# Patient Record
Sex: Male | Born: 1960 | Race: White | Hispanic: Refuse to answer | Marital: Married | Smoking: Never smoker
Health system: Southern US, Community
[De-identification: ages and names within clinical notes are randomized; demographics above are authoritative.]

## PROBLEM LIST (undated history)

## (undated) DIAGNOSIS — J3089 Other allergic rhinitis: Secondary | ICD-10-CM

## (undated) DIAGNOSIS — E785 Hyperlipidemia, unspecified: Secondary | ICD-10-CM

## (undated) DIAGNOSIS — N4340 Spermatocele of epididymis, unspecified: Secondary | ICD-10-CM

## (undated) DIAGNOSIS — K219 Gastro-esophageal reflux disease without esophagitis: Secondary | ICD-10-CM

## (undated) DIAGNOSIS — Z973 Presence of spectacles and contact lenses: Secondary | ICD-10-CM

## (undated) DIAGNOSIS — J45909 Unspecified asthma, uncomplicated: Secondary | ICD-10-CM

## (undated) DIAGNOSIS — Z9189 Other specified personal risk factors, not elsewhere classified: Secondary | ICD-10-CM

## (undated) HISTORY — PX: HERNIA REPAIR: SHX51

## (undated) HISTORY — PX: TONSILLECTOMY: SUR1361

## (undated) HISTORY — DX: Hyperlipidemia, unspecified: E78.5

---

## 2004-07-26 ENCOUNTER — Ambulatory Visit: Payer: Self-pay | Admitting: Family Medicine

## 2004-11-05 ENCOUNTER — Ambulatory Visit: Payer: Self-pay | Admitting: Family Medicine

## 2008-10-06 ENCOUNTER — Encounter: Admission: RE | Admit: 2008-10-06 | Discharge: 2008-10-06 | Payer: Self-pay | Admitting: Internal Medicine

## 2010-08-04 ENCOUNTER — Encounter: Payer: Self-pay | Admitting: Internal Medicine

## 2010-10-25 ENCOUNTER — Other Ambulatory Visit: Payer: Self-pay | Admitting: Internal Medicine

## 2010-10-25 DIAGNOSIS — E049 Nontoxic goiter, unspecified: Secondary | ICD-10-CM

## 2010-11-07 ENCOUNTER — Ambulatory Visit
Admission: RE | Admit: 2010-11-07 | Discharge: 2010-11-07 | Disposition: A | Discharge status: Home / Self Care | Payer: BC Managed Care – PPO | Source: Ambulatory Visit | Attending: Internal Medicine | Admitting: Internal Medicine

## 2010-11-07 DIAGNOSIS — E049 Nontoxic goiter, unspecified: Secondary | ICD-10-CM

## 2011-01-22 ENCOUNTER — Encounter (INDEPENDENT_AMBULATORY_CARE_PROVIDER_SITE_OTHER): Payer: Self-pay | Admitting: Surgery

## 2011-01-22 ENCOUNTER — Ambulatory Visit (INDEPENDENT_AMBULATORY_CARE_PROVIDER_SITE_OTHER): Payer: BC Managed Care – PPO | Admitting: Surgery

## 2011-01-22 VITALS — BP 112/78 | HR 75 | Temp 97.6°F | Wt 204.0 lb

## 2011-01-22 DIAGNOSIS — R221 Localized swelling, mass and lump, neck: Secondary | ICD-10-CM

## 2011-01-22 DIAGNOSIS — R220 Localized swelling, mass and lump, head: Secondary | ICD-10-CM

## 2011-01-22 NOTE — Progress Notes (Signed)
Chief Complaint  Patient presents with  . Mass    under thyroid gland    HISTORY: Patient is a 50 year old white male referred by his primary physician for evaluation of newly identified left neck mass. Patient was seen in April 2012. The neck examination was felt to be abnormal. Patient was referred for ultrasound of the thyroid performed on November 07, 2010. This showed a normal sized thyroid gland with heterogeneous tissue. No nodule was greater than 5 mm in size. However there was a solid mass inferior to the lower pole of the left thyroid lobe measuring 1.6 cm in dimension. Differential diagnosis included lymph node versus thyroid tissue versus parathyroid tissue. Biopsy was recommended.  Patient has no personal history of thyroid disease. He is never been on thyroid medication. He has had no prior head or neck surgery.  Patient's mother did undergo thyroidectomy for unknown cause. Patient's sister takes thyroid medication. There is no other family history of endocrinopathy.   Past Medical History  Diagnosis Date  . Asthma     childhood  . Alopecia      No current outpatient prescriptions on file.   Allergies  Allergen Reactions  . Compazine Nausea And Vomiting     Family History  Problem Relation Age of Onset  . Dementia Mother   . Thyroid disease Mother   . Colitis Mother   . Hypertension Father   . Thyroid disease Sister      History  Substance Use Topics  . Smoking status: Never Smoker   . Smokeless tobacco: Not on file  . Alcohol Use: Yes     social     PERTINENT POSITIVES FROM REVIEW OF SYSTEMS: Patient denies any symptoms related to the neck. He has no dysphagia. He has not noted any masses or tenderness. He denies tremor. He denies palpitations.   EXAM: Filed Vitals:   01/22/11 1034  BP: 112/78  Pulse: 75  Temp: 97.6 F (36.4 C)      LABORATORY RESULTS: See E-Chart for most recent results   RADIOLOGY RESULTS: See E-Chart or I-Site for  most recent results   IMPRESSION: Left neck mass, 1.6 cm, below the left thyroid lobe. Finding of uncertain significance. Differential diagnosis includes lymph node versus ectopic thyroid tissue versus parathyroid tissue.   PLAN: I discussed these findings at length with the patient. Given that he has a normal serum calcium level was 8.9, I think it is unlikely that this represents a parathyroid adenoma. The patient is completely asymptomatic. He is on ultrasound of the thyroid indicates probable underlying Hashimoto's thyroiditis.  I think the safest course of action is to proceed with ultrasound-guided fine-needle aspiration biopsy. We will make arrangements for the study in the near future. I will contact the patient with the results of the cytopathology. If this is a benign finding such as a benign lymph node or a benign thyroid tissue, then I think he can be safely observed with a followup ultrasound examination in 6 months. If there is any evidence of atypia or abnormality, we will consider surgical excisional biopsy at that time.  We will await the results of cytopathology in contact the patient.

## 2011-01-28 ENCOUNTER — Other Ambulatory Visit (INDEPENDENT_AMBULATORY_CARE_PROVIDER_SITE_OTHER): Payer: Self-pay | Admitting: Surgery

## 2011-01-28 DIAGNOSIS — R221 Localized swelling, mass and lump, neck: Secondary | ICD-10-CM

## 2011-01-28 DIAGNOSIS — R220 Localized swelling, mass and lump, head: Secondary | ICD-10-CM

## 2011-02-05 ENCOUNTER — Telehealth (INDEPENDENT_AMBULATORY_CARE_PROVIDER_SITE_OTHER): Payer: Self-pay | Admitting: Surgery

## 2011-02-12 ENCOUNTER — Other Ambulatory Visit (HOSPITAL_COMMUNITY)
Admission: RE | Admit: 2011-02-12 | Discharge: 2011-02-12 | Disposition: A | Discharge status: Home / Self Care | Payer: BC Managed Care – PPO | Source: Ambulatory Visit | Attending: Interventional Radiology | Admitting: Interventional Radiology

## 2011-02-12 ENCOUNTER — Ambulatory Visit
Admission: RE | Admit: 2011-02-12 | Discharge: 2011-02-12 | Disposition: A | Discharge status: Home / Self Care | Payer: BC Managed Care – PPO | Source: Ambulatory Visit | Attending: Surgery | Admitting: Surgery

## 2011-02-12 DIAGNOSIS — R220 Localized swelling, mass and lump, head: Secondary | ICD-10-CM

## 2011-02-12 DIAGNOSIS — E049 Nontoxic goiter, unspecified: Secondary | ICD-10-CM | POA: Insufficient documentation

## 2011-03-03 ENCOUNTER — Other Ambulatory Visit (INDEPENDENT_AMBULATORY_CARE_PROVIDER_SITE_OTHER): Payer: Self-pay | Admitting: Surgery

## 2011-03-03 ENCOUNTER — Telehealth (INDEPENDENT_AMBULATORY_CARE_PROVIDER_SITE_OTHER): Payer: Self-pay

## 2011-03-03 DIAGNOSIS — E041 Nontoxic single thyroid nodule: Secondary | ICD-10-CM

## 2011-03-03 NOTE — Telephone Encounter (Signed)
Results given to patient Friday 03/31/2011- Thyroid ultrasound ordered and to be completed in February 2012. RMP

## 2011-06-26 ENCOUNTER — Encounter (INDEPENDENT_AMBULATORY_CARE_PROVIDER_SITE_OTHER): Payer: Self-pay | Admitting: Surgery

## 2011-08-12 ENCOUNTER — Other Ambulatory Visit: Payer: Self-pay | Admitting: Ophthalmology

## 2011-08-14 ENCOUNTER — Encounter (INDEPENDENT_AMBULATORY_CARE_PROVIDER_SITE_OTHER): Payer: BC Managed Care – PPO | Admitting: Surgery

## 2011-09-03 ENCOUNTER — Ambulatory Visit
Admission: RE | Admit: 2011-09-03 | Discharge: 2011-09-03 | Disposition: A | Discharge status: Home / Self Care | Payer: BC Managed Care – PPO | Source: Ambulatory Visit | Attending: Surgery | Admitting: Surgery

## 2011-09-03 DIAGNOSIS — E041 Nontoxic single thyroid nodule: Secondary | ICD-10-CM

## 2011-09-24 ENCOUNTER — Encounter (INDEPENDENT_AMBULATORY_CARE_PROVIDER_SITE_OTHER): Payer: Self-pay | Admitting: Surgery

## 2011-09-24 ENCOUNTER — Ambulatory Visit (INDEPENDENT_AMBULATORY_CARE_PROVIDER_SITE_OTHER): Payer: BC Managed Care – PPO | Admitting: Surgery

## 2011-09-24 VITALS — BP 132/78 | HR 68 | Temp 97.8°F | Resp 18 | Ht 69.0 in | Wt 210.0 lb

## 2011-09-24 DIAGNOSIS — D449 Neoplasm of uncertain behavior of unspecified endocrine gland: Secondary | ICD-10-CM

## 2011-09-24 NOTE — Patient Instructions (Signed)
Thyroid Diseases Your thyroid is a butterfly-shaped gland in your neck. It is located just above your collarbone. It is one of your endocrine glands, which make hormones. The thyroid helps set your metabolism. Metabolism is how your body gets energy from the foods you eat.  Millions of people have thyroid diseases. Women experience thyroid problems more often than men. In fact, overactive thyroid problems (hyperthyroidism) occur in 1% of all women. If you have a thyroid disease, your body may use energy more slowly or quickly than it should.  Thyroid problems also include an immune disease where your body reacts against your thyroid gland (called thyroiditis). A different problem involves lumps and bumps (called nodules) that develop in the gland. The nodules are usually, but not always, noncancerous. THE MOST COMMON THYROID PROBLEMS AND CAUSES ARE DISCUSSED BELOW There are many causes for thyroid problems. Treatment depends upon the exact diagnosis and includes trying to reset your body's metabolism to a normal rate. Hyperthyroidism Too much thyroid hormone from an overactive thyroid gland is called hyperthyroidism. In hyperthyroidism, the body's metabolism speeds up. One of the most frequent forms of hyperthyroidism is known as Graves' disease. Graves' disease tends to run in families. Although Graves' is thought to be caused by a problem with the immune system, the exact nature of the genetic problem is unknown. Hypothyroidism Too little thyroid hormone from an underactive thyroid gland is called hypothyroidism. In hypothyroidism, the body's metabolism is slowed. Several things can cause this condition. Most causes affect the thyroid gland directly and hurt its ability to make enough hormone.  Rarely, there may be a pituitary gland tumor (located near the base of the brain). The tumor can block the pituitary from producing thyroid-stimulating hormone (TSH). Your body makes TSH to stimulate the thyroid  to work properly. If the pituitary does not make enough TSH, the thyroid fails to make enough hormones needed for good health. Whether the problem is caused by thyroid conditions or by the pituitary gland, the result is that the thyroid is not making enough hormones. Hypothyroidism causes many physical and mental processes to become sluggish. The body consumes less oxygen and produces less body heat. Thyroid Nodules A thyroid nodule is a small swelling or lump in the thyroid gland. They are common. These nodules represent either a growth of thyroid tissue or a fluid-filled cyst. Both form a lump in the thyroid gland. Almost half of all people will have tiny thyroid nodules at some point in their lives. Typically, these are not noticeable until they become large and affect normal thyroid size. Larger nodules that are greater than a half inch across (about 1 centimeter) occur in about 5 percent of people. Although most nodules are not cancerous, people who have them should seek medical care to rule out cancer. Also, some thyroid nodules may produce too much thyroid hormone or become too large. Large nodules or a large gland can interfere with breathing or swallowing or may cause neck discomfort. Other problems Other thyroid problems include cancer and thyroiditis. Thyroiditis is a malfunction of the body's immune system. Normally, the immune system works to defend the body against infection and other problems. When the immune system is not working properly, it may mistakenly attack normal cells, tissues, and organs. Examples of autoimmune diseases are Hashimoto's thyroiditis (which causes low thyroid function) and Graves' disease (which causes excess thyroid function). SYMPTOMS  Symptoms vary greatly depending upon the exact type of problem with the thyroid. Hyperthyroidism-is when your thyroid is too   active and makes more thyroid hormone than your body needs. The most common cause is Graves' Disease. Too  much thyroid hormone can cause some or all of the following symptoms:  Anxiety.   Irritability.   Difficulty sleeping.   Fatigue.   A rapid or irregular heartbeat.   A fine tremor of your hands or fingers.   An increase in perspiration.   Sensitivity to heat.   Weight loss, despite normal food intake.   Brittle hair.   Enlargement of your thyroid gland (goiter).   Light menstrual periods.   Frequent bowel movements.  Graves' disease can specifically cause eye and skin problems. The skin problems involve reddening and swelling of the skin, often on your shins and on the top of your feet. Eye problems can include the following:  Excess tearing and sensation of grit or sand in either or both eyes.   Reddened or inflamed eyes.   Widening of the space between your eyelids.   Swelling of the lids and tissues around the eyes.   Light sensitivity.   Ulcers on the cornea.   Double vision.   Limited eye movements.   Blurred or reduced vision.  Hypothyroidism- is when your thyroid gland is not active enough. This is more common than hyperthyroidism. Symptoms can vary a lot depending of the severity of the hormone deficiency. Symptoms may develop over a long period of time and can include several of the following:  Fatigue.   Sluggishness.   Increased sensitivity to cold.   Constipation.   Pale, dry skin.   A puffy face.   Hoarse voice.   High blood cholesterol level.   Unexplained weight gain.   Muscle aches, tenderness and stiffness.   Pain, stiffness or swelling in your joints.   Muscle weakness.   Heavier than normal menstrual periods.   Brittle fingernails and hair.   Depression.  Thyroid Nodules - most do not cause signs or symptoms. Occasionally, some may become so large that you can feel or even see the swelling at the base of your neck. You may realize a lump or swelling is there when you are shaving or putting on makeup. Men might become  aware of a nodule when shirt collars suddenly feel too tight. Some nodules produce too much thyroid hormone. This can produce the same symptoms as hyperthyroidism (see above). Thyroid nodules are seldom cancerous. However, a nodule is more likely to be malignant (cancerous) if it:  Grows quickly or feels hard.   Causes you to become hoarse or to have trouble swallowing or breathing.   Causes enlarged lymph nodes under your jaw or in your neck.  DIAGNOSIS  Because there are so many possible thyroid conditions, your caregiver may ask for a number of tests. They will do this in order to narrow down the exact diagnosis. These tests can include:  Blood and antibody tests.   Special thyroid scans using small, safe amounts of radioactive iodine.   Ultrasound of the thyroid gland (particularly if there is a nodule or lump).   Biopsy. This is usually done with a special needle. A needle biopsy is a procedure to obtain a sample of cells from the thyroid. The tissue will be tested in a lab and examined under a microscope.  TREATMENT  Treatment depends on the exact diagnosis. Hyperthyroidism  Beta-blockers help relieve many of the symptoms.   Anti-thyroid medications prevent the thyroid from making excess hormones.   Radioactive iodine treatment can destroy overactive thyroid   cells. The iodine can permanently decrease the amount of hormone produced.   Surgery to remove the thyroid gland.   Treatments for eye problems that come from Graves' disease also include medications and special eye surgery, if felt to be appropriate.  Hypothyroidism Thyroid replacement with levothyroxine is the mainstay of treatment. Treatment with thyroid replacement is usually lifelong and will require monitoring and adjustment from time to time. Thyroid Nodules  Watchful waiting. If a small nodule causes no symptoms or signs of cancer on biopsy, then no treatment may be chosen at first. Re-exam and re-checking blood  tests would be the recommended follow-up.   Anti-thyroid medications or radioactive iodine treatment may be recommended if the nodules produce too much thyroid hormone (see Treatment for Hyperthyroidism above).   Alcohol ablation. Injections of small amounts of ethyl alcohol (ethanol) can cause a non-cancerous nodule to shrink in size.   Surgery (see Treatment for Hyperthyroidism above).  HOME CARE INSTRUCTIONS   Take medications as instructed.   Follow through on recommended testing.  SEEK MEDICAL CARE IF:   You feel that you are developing symptoms of Hyperthyroidism or Hypothyroidism as described above.   You develop a new lump/nodule in the neck/thyroid area that you had not noticed before.   You feel that you are having side effects from medicines prescribed.   You develop trouble breathing or swallowing.  SEEK IMMEDIATE MEDICAL CARE IF:   You develop a fever of 102 F (38.9 C) or higher.   You develop severe sweating.   You develop palpitations and/or rapid heart beat.   You develop shortness of breath.   You develop nausea and vomiting.   You develop extreme shakiness.   You develop agitation.   You develop lightheadedness or have a fainting episode.  Document Released: 04/27/2007 Document Revised: 06/19/2011 Document Reviewed: 04/27/2007 [employer] Patient Information 2012 [employer]. 

## 2011-09-24 NOTE — Progress Notes (Signed)
Visit Diagnoses: 1. Neoplasm of uncertain behavior, left thyroid lobe     HISTORY: The patient is a 51 year old white male who returns for followup of a left neck mass. Patient was evaluated in July 2013. An attempt was made at fine needle aspiration biopsy but a non-diagnostic sample was obtained. At my request the patient underwent a followup thyroid ultrasound in February 2013. This shows a normal-sized thyroid gland which is inhomogeneous. There is a nodule at the left inferior aspect of the thyroid gland measuring 1.6 x 0.8 x 1.0 cm. The radiologist is not certain whether this represents a thyroid nodule for an enlarged parathyroid gland. Calcium levels have been normal.  PERTINENT REVIEW OF SYSTEMS: Patient denies any change in self-examination. He denies any new masses. He denies any discomfort. He denies any compressive symptoms. He denies tremor. He denies palpitations except with stress.  EXAM: HEENT: normocephalic; pupils equal and reactive; sclerae clear; dentition good; mucous membranes moist NECK:  On palpation there are no dominant or discrete masses noted; symmetric on extension; no palpable anterior or posterior cervical lymphadenopathy; no supraclavicular masses; no tenderness CHEST: clear to auscultation bilaterally without rales, rhonchi, or wheezes CARDIAC: regular rate and rhythm without significant murmur; peripheral pulses are full EXT:  non-tender without edema; no deformity NEURO: no gross focal deficits; no sign of tremor   IMPRESSION: Left neck mass adjacent to left lobe of thyroid, 1.6 cm, of undetermined significance  PLAN: The patient and I discussed all of the above findings. At this point I see no wear for surgical intervention. I would like to have some laboratory studies checked and we will obtain these at the time of his routine physical exam in April with his primary care physician. I have requested a TSH level, and intact PTH level, and a serum calcium  level. Also, I am going to ask the patient to return in one year with a followup thyroid ultrasound performed prior to that office visit.  Velora Heckler, MD, FACS General & Endocrine Surgery [facility]

## 2011-12-11 ENCOUNTER — Encounter (INDEPENDENT_AMBULATORY_CARE_PROVIDER_SITE_OTHER): Payer: Self-pay

## 2012-09-06 ENCOUNTER — Ambulatory Visit
Admission: RE | Admit: 2012-09-06 | Discharge: 2012-09-06 | Disposition: A | Discharge status: Home / Self Care | Payer: BC Managed Care – PPO | Source: Ambulatory Visit | Attending: Surgery | Admitting: Surgery

## 2012-09-06 DIAGNOSIS — D449 Neoplasm of uncertain behavior of unspecified endocrine gland: Secondary | ICD-10-CM

## 2012-09-07 ENCOUNTER — Other Ambulatory Visit (INDEPENDENT_AMBULATORY_CARE_PROVIDER_SITE_OTHER): Payer: Self-pay

## 2012-09-07 ENCOUNTER — Telehealth (INDEPENDENT_AMBULATORY_CARE_PROVIDER_SITE_OTHER): Payer: Self-pay

## 2012-09-07 DIAGNOSIS — E042 Nontoxic multinodular goiter: Secondary | ICD-10-CM

## 2012-09-07 NOTE — Telephone Encounter (Signed)
Pt advised recall due. Pt requests to call back. Pt due for thyroid u/s at [facility] and [facility]. Order in epic.

## 2012-09-08 ENCOUNTER — Telehealth (INDEPENDENT_AMBULATORY_CARE_PROVIDER_SITE_OTHER): Payer: Self-pay | Admitting: General Surgery

## 2012-09-08 ENCOUNTER — Telehealth (INDEPENDENT_AMBULATORY_CARE_PROVIDER_SITE_OTHER): Payer: Self-pay

## 2012-09-08 NOTE — Telephone Encounter (Signed)
Pt notified of u/s result and to f/u with PCP. Pt states he will.

## 2012-09-08 NOTE — Telephone Encounter (Signed)
Pt called and reported he was contacted by Dr. Ardine Eng nurse.  Related the message in Epic.  States he has already had the U/S (on Monday) and would prefer not taking additional time off from work to come in, but he will if Dr. Gerrit Friends needs to see him. He can most easily on his cell phone:  [phone].

## 2012-09-08 NOTE — Telephone Encounter (Signed)
Message copied by Joanette Gula on Wed Sep 08, 2012  4:15 PM ------      Message from: Velora Heckler      Created: Wed Sep 08, 2012  3:28 PM       Cindy:            The ultrasound for this patient is fine - no new or dominant masses seen.            He may cancel his follow up with me.            No further visits needed.  He should see his primary care provider for annual physical exams of his thyroid.            tmg            Velora Heckler, MD, [facility]      Office: [phone]             ------

## 2013-12-09 ENCOUNTER — Other Ambulatory Visit: Payer: Self-pay | Admitting: Urology

## 2014-02-13 ENCOUNTER — Encounter (HOSPITAL_BASED_OUTPATIENT_CLINIC_OR_DEPARTMENT_OTHER): Payer: Self-pay | Admitting: *Deleted

## 2014-02-13 NOTE — Progress Notes (Signed)
NPO AFTER MN.  ARRIVE AT 0600.  NEEDS HG.  

## 2014-02-13 NOTE — Progress Notes (Signed)
02/13/14 1106  OBSTRUCTIVE SLEEP APNEA  Have you ever been diagnosed with sleep apnea through a sleep study? No  Do you snore loudly (loud enough to be heard through closed doors)?  1  Do you often feel tired, fatigued, or sleepy during the daytime? 0  Has anyone observed you stop breathing during your sleep? 0  Do you have, or are you being treated for high blood pressure? 0  BMI more than 35 kg/m2? 0  Age over 55 years old? 1  Neck circumference greater than 40 cm/16 inches? 1  Gender: 1  Obstructive Sleep Apnea Score 4  Score 4 or greater  Results sent to PCP

## 2014-02-16 NOTE — Anesthesia Preprocedure Evaluation (Addendum)
Anesthesia Evaluation  Patient identified by MRN, date of birth, ID band Patient awake    Reviewed: Allergy & Precautions, H&P , NPO status , Patient's Chart, lab work & pertinent test results  Airway Mallampati: II TM Distance: >3 FB Neck ROM: full    Dental no notable dental hx. (+) Teeth Intact, Dental Advisory Given   Pulmonary neg pulmonary ROS,  Stop bang 4 breath sounds clear to auscultation  Pulmonary exam normal       Cardiovascular Exercise Tolerance: Good negative cardio ROS  Rhythm:regular Rate:Normal     Neuro/Psych negative neurological ROS  negative psych ROS   GI/Hepatic negative GI ROS, Neg liver ROS, GERD-  Medicated and Controlled,  Endo/Other  negative endocrine ROS  Renal/GU negative Renal ROS  negative genitourinary   Musculoskeletal   Abdominal   Peds  Hematology negative hematology ROS (+)   Anesthesia Other Findings   Reproductive/Obstetrics negative OB ROS                          Anesthesia Physical Anesthesia Plan  ASA: II  Anesthesia Plan: General   Post-op Pain Management:    Induction: Intravenous  Airway Management Planned: LMA  Additional Equipment:   Intra-op Plan:   Post-operative Plan: Extubation in OR  Informed Consent: I have reviewed the patients History and Physical, chart, labs and discussed the procedure including the risks, benefits and alternatives for the proposed anesthesia with the patient or authorized representative who has indicated his/her understanding and acceptance.   Dental Advisory Given  Plan Discussed with: CRNA and Surgeon  Anesthesia Plan Comments:         Anesthesia Quick Evaluation

## 2014-02-17 ENCOUNTER — Ambulatory Visit (HOSPITAL_BASED_OUTPATIENT_CLINIC_OR_DEPARTMENT_OTHER)
Admission: RE | Admit: 2014-02-17 | Discharge: 2014-02-17 | Disposition: A | Discharge status: Home / Self Care | Payer: BC Managed Care – PPO | Source: Ambulatory Visit | Attending: Urology | Admitting: Urology

## 2014-02-17 ENCOUNTER — Encounter (HOSPITAL_BASED_OUTPATIENT_CLINIC_OR_DEPARTMENT_OTHER): Payer: BC Managed Care – PPO | Admitting: Anesthesiology

## 2014-02-17 ENCOUNTER — Encounter (HOSPITAL_BASED_OUTPATIENT_CLINIC_OR_DEPARTMENT_OTHER)
Admission: RE | Disposition: A | Discharge status: Home / Self Care | Payer: Self-pay | Source: Ambulatory Visit | Attending: Urology

## 2014-02-17 ENCOUNTER — Encounter (HOSPITAL_BASED_OUTPATIENT_CLINIC_OR_DEPARTMENT_OTHER): Payer: Self-pay | Admitting: *Deleted

## 2014-02-17 ENCOUNTER — Ambulatory Visit (HOSPITAL_BASED_OUTPATIENT_CLINIC_OR_DEPARTMENT_OTHER): Payer: BC Managed Care – PPO | Admitting: Anesthesiology

## 2014-02-17 DIAGNOSIS — J45909 Unspecified asthma, uncomplicated: Secondary | ICD-10-CM | POA: Insufficient documentation

## 2014-02-17 DIAGNOSIS — N503 Cyst of epididymis: Secondary | ICD-10-CM

## 2014-02-17 DIAGNOSIS — N4340 Spermatocele of epididymis, unspecified: Secondary | ICD-10-CM | POA: Insufficient documentation

## 2014-02-17 DIAGNOSIS — K219 Gastro-esophageal reflux disease without esophagitis: Secondary | ICD-10-CM | POA: Insufficient documentation

## 2014-02-17 HISTORY — PX: SPERMATOCELECTOMY: SHX2420

## 2014-02-17 HISTORY — DX: Spermatocele of epididymis, unspecified: N43.40

## 2014-02-17 HISTORY — DX: Other specified personal risk factors, not elsewhere classified: Z91.89

## 2014-02-17 HISTORY — DX: Other allergic rhinitis: J30.89

## 2014-02-17 HISTORY — DX: Presence of spectacles and contact lenses: Z97.3

## 2014-02-17 HISTORY — DX: Gastro-esophageal reflux disease without esophagitis: K21.9

## 2014-02-17 LAB — POCT HEMOGLOBIN-HEMACUE: Hemoglobin: 15.7 g/dL (ref 13.0–17.0)

## 2014-02-17 SURGERY — EXCISION, SPERMATOCELE
Anesthesia: General | Site: Scrotum | Laterality: Left

## 2014-02-17 MED ORDER — FENTANYL CITRATE 0.05 MG/ML IJ SOLN
INTRAMUSCULAR | Status: DC | PRN
  Administered 2014-02-17: 50 ug via INTRAVENOUS

## 2014-02-17 MED ORDER — FENTANYL CITRATE 0.05 MG/ML IJ SOLN
INTRAMUSCULAR | Status: AC
  Filled 2014-02-17: qty 4

## 2014-02-17 MED ORDER — OXYCODONE-ACETAMINOPHEN 10-325 MG PO TABS: 1.0000 | ORAL_TABLET | ORAL | Status: AC | PRN

## 2014-02-17 MED ORDER — ONDANSETRON HCL 4 MG/2ML IJ SOLN
INTRAMUSCULAR | Status: DC | PRN
  Administered 2014-02-17: 4 mg via INTRAVENOUS

## 2014-02-17 MED ORDER — ACETAMINOPHEN 10 MG/ML IV SOLN
INTRAVENOUS | Status: DC | PRN
  Administered 2014-02-17: 1000 mg via INTRAVENOUS

## 2014-02-17 MED ORDER — MIDAZOLAM HCL 2 MG/2ML IJ SOLN
INTRAMUSCULAR | Status: AC
  Filled 2014-02-17: qty 2

## 2014-02-17 MED ORDER — BUPIVACAINE HCL (PF) 0.25 % IJ SOLN
INTRAMUSCULAR | Status: DC | PRN
  Administered 2014-02-17: 10 mL

## 2014-02-17 MED ORDER — CEFAZOLIN SODIUM-DEXTROSE 2-3 GM-% IV SOLR
2.0000 g | INTRAVENOUS | Status: AC
  Administered 2014-02-17: 2 g via INTRAVENOUS
  Filled 2014-02-17: qty 50

## 2014-02-17 MED ORDER — SODIUM CHLORIDE 0.9 % IR SOLN
Status: DC | PRN
  Administered 2014-02-17: 500 mL

## 2014-02-17 MED ORDER — LACTATED RINGERS IV SOLN
INTRAVENOUS | Status: DC
  Administered 2014-02-17: 07:00:00 via INTRAVENOUS
  Filled 2014-02-17: qty 1000

## 2014-02-17 MED ORDER — FENTANYL CITRATE 0.05 MG/ML IJ SOLN
25.0000 ug | INTRAMUSCULAR | Status: DC | PRN
  Filled 2014-02-17: qty 1

## 2014-02-17 MED ORDER — LIDOCAINE HCL (CARDIAC) 20 MG/ML IV SOLN
INTRAVENOUS | Status: DC | PRN
  Administered 2014-02-17: 100 mg via INTRAVENOUS

## 2014-02-17 MED ORDER — KETOROLAC TROMETHAMINE 30 MG/ML IJ SOLN
INTRAMUSCULAR | Status: DC | PRN
  Administered 2014-02-17: 30 mg via INTRAVENOUS

## 2014-02-17 MED ORDER — DEXAMETHASONE SODIUM PHOSPHATE 4 MG/ML IJ SOLN
INTRAMUSCULAR | Status: DC | PRN
  Administered 2014-02-17: 4 mg via INTRAVENOUS

## 2014-02-17 MED ORDER — MIDAZOLAM HCL 5 MG/5ML IJ SOLN
INTRAMUSCULAR | Status: DC | PRN
Start: 2014-02-17 — End: 2014-02-17
  Administered 2014-02-17: 2 mg via INTRAVENOUS

## 2014-02-17 MED ORDER — BACITRACIN-NEOMYCIN-POLYMYXIN OINTMENT TUBE
TOPICAL_OINTMENT | CUTANEOUS | Status: DC | PRN
Start: 2014-02-17 — End: 2014-02-17
  Administered 2014-02-17: 1 via TOPICAL

## 2014-02-17 MED ORDER — PROPOFOL 10 MG/ML IV BOLUS
INTRAVENOUS | Status: DC | PRN
  Administered 2014-02-17: 200 mg via INTRAVENOUS

## 2014-02-17 MED ORDER — LACTATED RINGERS IV SOLN
INTRAVENOUS | Status: DC
  Filled 2014-02-17: qty 1000

## 2014-02-17 MED ORDER — GLYCOPYRROLATE 0.2 MG/ML IJ SOLN
INTRAMUSCULAR | Status: DC | PRN
  Administered 2014-02-17: 0.2 mg via INTRAVENOUS

## 2014-02-17 SURGICAL SUPPLY — 33 items
BLADE SURG 15 STRL LF DISP TIS (BLADE) ×1 IMPLANT
BLADE SURG 15 STRL SS (BLADE) ×2
BNDG GAUZE ELAST 4 BULKY (GAUZE/BANDAGES/DRESSINGS) ×1 IMPLANT
CANISTER SUCTION 1200CC (MISCELLANEOUS) ×1 IMPLANT
CLEANER CAUTERY TIP 5X5 PAD (MISCELLANEOUS) ×1 IMPLANT
CLOTH BEACON ORANGE TIMEOUT ST (SAFETY) ×2 IMPLANT
COVER MAYO STAND STRL (DRAPES) ×2 IMPLANT
COVER TABLE BACK 60X90 (DRAPES) ×2 IMPLANT
DRAIN PENROSE 18X1/4 LTX STRL (WOUND CARE) IMPLANT
DRAPE PED LAPAROTOMY (DRAPES) ×2 IMPLANT
ELECT REM PT RETURN 9FT ADLT (ELECTROSURGICAL) ×2
ELECTRODE REM PT RTRN 9FT ADLT (ELECTROSURGICAL) ×1 IMPLANT
GLOVE BIOGEL M 8.0 STRL (GLOVE) ×2 IMPLANT
GLOVE BIOGEL PI IND STRL 7.5 (GLOVE) IMPLANT
GLOVE BIOGEL PI INDICATOR 7.5 (GLOVE) ×1
GLOVE INDICATOR 7.5 STRL GRN (GLOVE) ×1 IMPLANT
GOWN STRL REUS W/ TWL XL LVL3 (GOWN DISPOSABLE) IMPLANT
GOWN STRL REUS W/TWL XL LVL3 (GOWN DISPOSABLE) ×4
NEEDLE HYPO 22GX1.5 SAFETY (NEEDLE) ×2 IMPLANT
NS IRRIG 500ML POUR BTL (IV SOLUTION) ×1 IMPLANT
PACK BASIN DAY SURGERY FS (CUSTOM PROCEDURE TRAY) ×2 IMPLANT
PAD CLEANER CAUTERY TIP 5X5 (MISCELLANEOUS) ×1
PENCIL BUTTON HOLSTER BLD 10FT (ELECTRODE) ×2 IMPLANT
SUT CHROMIC 3 0 SH 27 (SUTURE) ×1 IMPLANT
SUT CHROMIC 5 0 RB 1 27 (SUTURE) ×1 IMPLANT
SUT VIC AB 4-0 RB1 27 (SUTURE)
SUT VIC AB 4-0 RB1 27X BRD (SUTURE) IMPLANT
SUT VICRYL 6 0 RB 1 (SUTURE) IMPLANT
SYR BULB IRRIGATION 50ML (SYRINGE) ×1 IMPLANT
SYR CONTROL 10ML LL (SYRINGE) ×2 IMPLANT
TRAY DSU PREP LF (CUSTOM PROCEDURE TRAY) ×2 IMPLANT
TUBE CONNECTING 12X1/4 (SUCTIONS) ×1 IMPLANT
YANKAUER SUCT BULB TIP NO VENT (SUCTIONS) ×1 IMPLANT

## 2014-02-17 NOTE — Op Note (Signed)
PATIENT:  Sean Alvarez  PRE-OPERATIVE DIAGNOSIS: Left spermatocele  POST-OPERATIVE DIAGNOSIS:  Same  PROCEDURE:  Procedure(s): left spermatocelectomy  SURGEON:  Claybon Jabs  INDICATION: [name] is a 53 year old male who had a left epididymal cyst initially diagnosed in 6/06 by ultrasound and physical examination. Over time he indicated that it had increased in size and has become symptomatic. We therefore discussed surgical correction he has elected to proceed with that.  ANESTHESIA:  General  EBL:  Minimal  DRAINS: None  LOCAL MEDICATIONS USED:  10 cc of 1/4% Marcaine with epinephrine  SPECIMEN:  None  DISPOSITION OF SPECIMEN:  N/A  Description of procedure: After informed consent the patient was brought to the major OR, placed on the table and administered general anesthesia. His genitalia was then sterilely prepped and draped. An official timeout was then performed.  A midline median raphae scrotal incision was then made and carried down over the left epididymal cyst. The tissue over the cyst/spermatocele was cleared using a combination of sharp and blunt technique. I was able to dissect the cystic structure away from the tissue of the head of the epididymis. I was able to completely remove it intact. I noted no other pathology. I then closed the edges of the epididymal tissue with running, locking 5-0 chromic. The appendix testis was removed with electrocautery.  His testicle was then replaced in the normal anatomic position and his left hemiscrotum. I then closed the deep scrotal tissue with running 3-0 chromic suture in a locking fashion. I injected quarter percent Marcaine with epinephrine in the subcutaneous tissue and closed the skin with running 3-0 chromic. Neosporin, a sterile gauze dressing, fluff Kerlix and a scrotal support were applied. The patient tolerated the procedure well no intraoperative complications. Needle sponge and instrument counts were correct  at the end of the operation.   PLAN OF CARE: Discharge to home after PACU  PATIENT DISPOSITION:  PACU - hemodynamically stable.

## 2014-02-17 NOTE — H&P (Signed)
Reason For Visit Sean Alvarez is a 53 year old male with a spermatocele.   History of Present Illness Left epididymal cyst/spermatocele: I saw him initially in 6/06 with a scrotal ultrasound which revealed several epididymal cysts on the left-hand side involving the head and tail of the epididymis. He was then seen in 4/11 and reported that over time they had increased slightly in size.   Interval history: When I last saw him I told him to return if his spermatocele increased in size or became symptomatic. He indicates that that has occurred. It has both increased in size and it has become uncomfortable especially when he sits or his clothing binds against the area. He describes it as a dull ache. He denies any symptoms to suggest infection. He has no voiding symptoms other than slight decreased force of urinary stream. His spermatocele would be considered a moderate severity with no modifying factors or associated signs and symptoms other than as noted above.   Past Medical History Problems  1. History of Asthma (493.90) 2. History of heartburn (V12.79) 3. History of hypercholesterolemia (V12.29)  Surgical History Problems  1. History of Complete Colonoscopy 2. History of Inguinal Hernia Repair 3. History of Tonsillectomy  Current Meds 1. Albuterol AERS;  Therapy: (Recorded:26May2015) to Recorded 2. Sudafed TABS;  Therapy: (Recorded:26May2015) to Recorded  Allergies Medication  1. Compazine TABS  Family History Problems  1. Family history of Dementia : Mother 2. Family history of Family Health Status Number Of Children   2 sons 3. Family history of Alzheimer's disease (V17.2) : Mother 4. Family history of colitis (V18.59) : Mother 5. Family history of diabetes mellitus (V18.0) : Father 6. Family history of glaucoma (V19.11) : Father  Social History Problems    Alcohol Use   less than 1   Caffeine Use   4   Marital History - Currently Married   Never a smoker    Occupation:   executive   Denied: History of Tobacco Use  Review of Systems Genitourinary, constitutional, skin, eye, otolaryngeal, hematologic/lymphatic, cardiovascular, pulmonary, endocrine, musculoskeletal, gastrointestinal, neurological and psychiatric system(s) were reviewed and pertinent findings if present are noted.  Genitourinary: nocturia and testicular mass.  Gastrointestinal: heartburn.    Vitals Vital Signs  Height: 5 ft 9 in Weight: 205 lb  BMI Calculated: 30.27 BSA Calculated: 2.09 Blood Pressure: 139 / 76 Temperature: 97.9 F Heart Rate: 65  Physical Exam Constitutional: Well nourished and well developed . No acute distress.  ENT:. The ears and nose are normal in appearance.  Neck: The appearance of the neck is normal and no neck mass is present.  Pulmonary: No respiratory distress and normal respiratory rhythm and effort.  Cardiovascular: Heart rate and rhythm are normal . No peripheral edema.  Abdomen: The abdomen is soft and nontender. No masses are palpated. No CVA tenderness. No hernias are palpable. No hepatosplenomegaly noted.  Genitourinary: Examination of the penis demonstrates no discharge, no masses, no lesions and a normal meatus. The scrotum is without lesions. The right epididymis is palpably normal and non-tender. The left epididymis is found to have a spermatocele, but non-tender. The right testis is non-tender and without masses. The left testis is non-tender and without masses.  Lymphatics: The femoral and inguinal nodes are not enlarged or tender.  Skin: Normal skin turgor, no visible rash and no visible skin lesions.  Neuro/Psych:. Mood and affect are appropriate.    Results/Data Urine  COLOR YELLOW  APPEARANCE CLEAR  SPECIFIC GRAVITY 1.015  pH 6.5  GLUCOSE NEG mg/dL BILIRUBIN NEG  KETONE NEG mg/dL BLOOD NEG  PROTEIN NEG mg/dL UROBILINOGEN 0.2 mg/dL NITRITE NEG  LEUKOCYTE ESTERASE NEG   The following clinical lab reports were  reviewed:  UA: Clear.    Assessment   . Has a left spermatocele which has become symptomatic. It has increased in size and is uncomfortable. We therefore discussed the treatment options. He has elected to proceed with surgery and I went over the procedure with him in detail including the incision used, the outpatient nature of the procedure, the risks and complications as well as the anticipated postoperative course. He understands and has elected to proceed.   Plan   He will be scheduled for an outpatient left spermatocelectomy.

## 2014-02-17 NOTE — Transfer of Care (Signed)
Immediate Anesthesia Transfer of Care Note  Patient: Sean Alvarez  Procedure(s) Performed: Procedure(s): LEFT SPERMATOCELECTOMY (Left)  Patient Location: PACU  Anesthesia Type:General  Level of Consciousness: sedated and responds to stimulation  Airway & Oxygen Therapy: Patient Spontanous Breathing and Patient connected to nasal cannula oxygen  Post-op Assessment: Report given to PACU RN  Post vital signs: Reviewed and stable  Complications: No apparent anesthesia complications

## 2014-02-17 NOTE — Anesthesia Procedure Notes (Signed)
Procedure Name: LMA Insertion Date/Time: 02/17/2014 7:27 AM Performed by: Bethena Roys T Pre-anesthesia Checklist: Patient identified, Emergency Drugs available, Suction available and Patient being monitored Patient Re-evaluated:Patient Re-evaluated prior to inductionOxygen Delivery Method: Circle System Utilized Preoxygenation: Pre-oxygenation with 100% oxygen Intubation Type: IV induction Ventilation: Mask ventilation without difficulty LMA: LMA inserted LMA Size: 5.0 Number of attempts: 1 Airway Equipment and Method: bite block Placement Confirmation: positive ETCO2 Dental Injury: Teeth and Oropharynx as per pre-operative assessment

## 2014-02-17 NOTE — Anesthesia Postprocedure Evaluation (Signed)
  Anesthesia Post-op Note  Patient: Sean Alvarez  Procedure(s) Performed: Procedure(s) (LRB): LEFT SPERMATOCELECTOMY (Left)  Patient Location: PACU  Anesthesia Type: General  Level of Consciousness: awake and alert   Airway and Oxygen Therapy: Patient Spontanous Breathing  Post-op Pain: mild  Post-op Assessment: Post-op Vital signs reviewed, Patient's Cardiovascular Status Stable, Respiratory Function Stable, Patent Airway and No signs of Nausea or vomiting  Last Vitals:  Filed Vitals:   02/17/14 0900  BP:   Pulse: 60  Temp:   Resp: 13    Post-op Vital Signs: stable   Complications: No apparent anesthesia complications

## 2014-02-17 NOTE — Discharge Instructions (Signed)
Scrotal surgery postoperative instructions ° °Wound: ° °In most cases your incision will have absorbable sutures that will dissolve within the first 10-20 days. Some will fall out even earlier. Expect some redness as the sutures dissolved but this should occur only around the sutures. If there is generalized redness, especially with increasing pain or swelling, let us know. The scrotum will very likely get "black and blue" as the blood in the tissues spread. Sometimes the whole scrotum will turn colors. The black and blue is followed by a yellow and brown color. In time, all the discoloration will go away. In some cases some firm swelling in the area of the testicle may persist for up to 4-6 weeks after the surgery and is considered normal in most cases. ° °Diet: ° °You may return to your normal diet within 24 hours following your surgery. You may note some mild nausea and possibly vomiting the first 6-8 hours following surgery. This is usually due to the side effects of anesthesia, and will disappear quite soon. I would suggest clear liquids and a very light meal the first evening following your surgery. ° °Activity: ° °Your physical activity should be restricted the first 48 hours. During that time you should remain relatively inactive, moving about only when necessary. During the first 7-10 days following surgery he should avoid lifting any heavy objects (anything greater than 15 pounds), and avoid strenuous exercise. If you work, ask us specifically about your restrictions, both for work and home. We will write a note to your employer if needed. ° °You should plan to wear a tight pair of jockey shorts or an athletic supporter for the first 4-5 days, even to sleep. This will keep the scrotum immobilized to some degree and keep the swelling down. ° °Ice packs should be placed on and off over the scrotum for the first 48 hours. Frozen peas or corn in a ZipLock bag can be frozen, used and re-frozen. Fifteen minutes  on and 15 minutes off is a reasonable schedule. The ice is a good pain reliever and keeps the swelling down. ° °Hygiene: ° °You may shower 48 hours after your surgery. Tub bathing should be restricted until the seventh day. ° ° ° ° ° ° ° ° ° °Medication: ° °You will be sent home with some type of pain medication. In many cases you will be sent home with a narcotic pain pill (hydrococone or oxycodone). If the pain is not too bad, you may take either Tylenol (acetaminophen) or Advil (ibuprofen) which contain no narcotic agents, and might be tolerated a little better, with fewer side effects. If the pain medication you are sent home with does not control the pain, you will have to let us know. Some narcotic pain medications cannot be given or refilled by a phone call to a pharmacy. ° °Problems you should report to us: ° °· Fever of 101.0 degrees Fahrenheit or greater. °· Moderate or severe swelling under the skin incision or involving the scrotum. °· Drug reaction such as hives, a rash, nausea or vomiting. °·  ° ° °Post Anesthesia Home Care Instructions ° °Activity: °Get plenty of rest for the remainder of the day. A responsible adult should stay with you for 24 hours following the procedure.  °For the next 24 hours, DO NOT: °-Drive a car °-Operate machinery °-Drink alcoholic beverages °-Take any medication unless instructed by your physician °-Make any legal decisions or sign important papers. ° °Meals: °Start with liquid foods such as   tolerated. Avoid greasy, spicy, heavy foods. If nausea and/or vomiting occur, drink only clear liquids until the nausea and/or vomiting subsides. Call your physician if vomiting continues. ° °Special Instructions/Symptoms: °Your throat may feel dry or sore from the anesthesia or the breathing tube placed in your throat during surgery. If this causes discomfort, gargle with warm salt water. The discomfort should disappear within 24 hours. ° °

## 2014-02-20 ENCOUNTER — Encounter (HOSPITAL_BASED_OUTPATIENT_CLINIC_OR_DEPARTMENT_OTHER): Payer: Self-pay | Admitting: Urology

## 2016-07-16 DIAGNOSIS — R0981 Nasal congestion: Secondary | ICD-10-CM | POA: Diagnosis not present

## 2016-07-16 DIAGNOSIS — R590 Localized enlarged lymph nodes: Secondary | ICD-10-CM | POA: Diagnosis not present

## 2016-07-17 ENCOUNTER — Other Ambulatory Visit: Payer: Self-pay | Admitting: Internal Medicine

## 2016-07-17 DIAGNOSIS — R590 Localized enlarged lymph nodes: Secondary | ICD-10-CM

## 2016-07-29 ENCOUNTER — Ambulatory Visit
Admission: RE | Admit: 2016-07-29 | Discharge: 2016-07-29 | Disposition: A | Discharge status: Home / Self Care | Payer: BLUE CROSS/BLUE SHIELD | Source: Ambulatory Visit | Attending: Internal Medicine | Admitting: Internal Medicine

## 2016-07-29 DIAGNOSIS — R590 Localized enlarged lymph nodes: Secondary | ICD-10-CM

## 2016-07-29 DIAGNOSIS — R928 Other abnormal and inconclusive findings on diagnostic imaging of breast: Secondary | ICD-10-CM | POA: Diagnosis not present

## 2016-07-29 DIAGNOSIS — N6489 Other specified disorders of breast: Secondary | ICD-10-CM | POA: Diagnosis not present

## 2017-01-27 DIAGNOSIS — L723 Sebaceous cyst: Secondary | ICD-10-CM | POA: Diagnosis not present

## 2017-01-27 DIAGNOSIS — L0889 Other specified local infections of the skin and subcutaneous tissue: Secondary | ICD-10-CM | POA: Diagnosis not present

## 2017-01-27 DIAGNOSIS — L738 Other specified follicular disorders: Secondary | ICD-10-CM | POA: Diagnosis not present

## 2017-01-27 DIAGNOSIS — D485 Neoplasm of uncertain behavior of skin: Secondary | ICD-10-CM | POA: Diagnosis not present

## 2017-01-27 DIAGNOSIS — D225 Melanocytic nevi of trunk: Secondary | ICD-10-CM | POA: Diagnosis not present

## 2017-03-19 DIAGNOSIS — L738 Other specified follicular disorders: Secondary | ICD-10-CM | POA: Diagnosis not present

## 2017-04-21 DIAGNOSIS — Z125 Encounter for screening for malignant neoplasm of prostate: Secondary | ICD-10-CM | POA: Diagnosis not present

## 2017-04-21 DIAGNOSIS — Z0001 Encounter for general adult medical examination with abnormal findings: Secondary | ICD-10-CM | POA: Diagnosis not present

## 2017-05-01 DIAGNOSIS — J4520 Mild intermittent asthma, uncomplicated: Secondary | ICD-10-CM | POA: Diagnosis not present

## 2017-05-01 DIAGNOSIS — Z23 Encounter for immunization: Secondary | ICD-10-CM | POA: Diagnosis not present

## 2017-05-01 DIAGNOSIS — Z0000 Encounter for general adult medical examination without abnormal findings: Secondary | ICD-10-CM | POA: Diagnosis not present

## 2017-05-01 DIAGNOSIS — B001 Herpesviral vesicular dermatitis: Secondary | ICD-10-CM | POA: Diagnosis not present

## 2017-05-01 DIAGNOSIS — E039 Hypothyroidism, unspecified: Secondary | ICD-10-CM | POA: Diagnosis not present

## 2017-05-14 DIAGNOSIS — L720 Epidermal cyst: Secondary | ICD-10-CM | POA: Diagnosis not present

## 2017-05-26 DIAGNOSIS — L570 Actinic keratosis: Secondary | ICD-10-CM | POA: Diagnosis not present

## 2017-07-01 DIAGNOSIS — E039 Hypothyroidism, unspecified: Secondary | ICD-10-CM | POA: Diagnosis not present

## 2017-09-17 DIAGNOSIS — L309 Dermatitis, unspecified: Secondary | ICD-10-CM | POA: Diagnosis not present

## 2017-10-01 DIAGNOSIS — E039 Hypothyroidism, unspecified: Secondary | ICD-10-CM | POA: Diagnosis not present

## 2018-01-29 DIAGNOSIS — R05 Cough: Secondary | ICD-10-CM | POA: Diagnosis not present

## 2018-01-29 DIAGNOSIS — J3089 Other allergic rhinitis: Secondary | ICD-10-CM | POA: Diagnosis not present

## 2018-01-29 DIAGNOSIS — J301 Allergic rhinitis due to pollen: Secondary | ICD-10-CM | POA: Diagnosis not present

## 2018-01-29 DIAGNOSIS — J3081 Allergic rhinitis due to animal (cat) (dog) hair and dander: Secondary | ICD-10-CM | POA: Diagnosis not present

## 2018-03-15 ENCOUNTER — Encounter (HOSPITAL_COMMUNITY): Payer: Self-pay | Admitting: Emergency Medicine

## 2018-03-15 ENCOUNTER — Other Ambulatory Visit: Payer: Self-pay

## 2018-03-15 ENCOUNTER — Observation Stay (HOSPITAL_COMMUNITY)
Admission: EM | Admit: 2018-03-15 | Discharge: 2018-03-16 | Disposition: A | Discharge status: Home / Self Care | Payer: BLUE CROSS/BLUE SHIELD | Attending: Family Medicine | Admitting: Family Medicine

## 2018-03-15 DIAGNOSIS — Z888 Allergy status to other drugs, medicaments and biological substances status: Secondary | ICD-10-CM | POA: Diagnosis not present

## 2018-03-15 DIAGNOSIS — J45909 Unspecified asthma, uncomplicated: Secondary | ICD-10-CM | POA: Diagnosis not present

## 2018-03-15 DIAGNOSIS — H8301 Labyrinthitis, right ear: Secondary | ICD-10-CM

## 2018-03-15 DIAGNOSIS — R001 Bradycardia, unspecified: Secondary | ICD-10-CM

## 2018-03-15 DIAGNOSIS — J320 Chronic maxillary sinusitis: Secondary | ICD-10-CM | POA: Diagnosis not present

## 2018-03-15 DIAGNOSIS — R55 Syncope and collapse: Secondary | ICD-10-CM | POA: Diagnosis not present

## 2018-03-15 DIAGNOSIS — R42 Dizziness and giddiness: Secondary | ICD-10-CM

## 2018-03-15 DIAGNOSIS — R2681 Unsteadiness on feet: Secondary | ICD-10-CM | POA: Insufficient documentation

## 2018-03-15 DIAGNOSIS — R112 Nausea with vomiting, unspecified: Secondary | ICD-10-CM | POA: Diagnosis not present

## 2018-03-15 DIAGNOSIS — Z79899 Other long term (current) drug therapy: Secondary | ICD-10-CM | POA: Insufficient documentation

## 2018-03-15 DIAGNOSIS — E039 Hypothyroidism, unspecified: Secondary | ICD-10-CM | POA: Insufficient documentation

## 2018-03-15 DIAGNOSIS — L089 Local infection of the skin and subcutaneous tissue, unspecified: Secondary | ICD-10-CM | POA: Diagnosis not present

## 2018-03-15 DIAGNOSIS — R0902 Hypoxemia: Secondary | ICD-10-CM | POA: Diagnosis not present

## 2018-03-15 DIAGNOSIS — G4489 Other headache syndrome: Secondary | ICD-10-CM | POA: Diagnosis not present

## 2018-03-15 DIAGNOSIS — R1110 Vomiting, unspecified: Secondary | ICD-10-CM | POA: Diagnosis not present

## 2018-03-15 HISTORY — DX: Unspecified asthma, uncomplicated: J45.909

## 2018-03-15 NOTE — ED Triage Notes (Signed)
Pt arrived EMS for reports of dizziness that started at 4pm today while walking at [employer]. Per EMS pt went home and laid down and started feeling better but when he woke up from a nap the symptoms started all over again. Reports that he has had some tinnitus recently and is currently on doxycycline for a skin infection.  Vitals with EMS BP 136/78 P 56 RR 18 02: 98%RA CBG 118 20Lhand: 4mg  zofran PTA with EMS

## 2018-03-16 ENCOUNTER — Emergency Department (HOSPITAL_COMMUNITY): Payer: BLUE CROSS/BLUE SHIELD

## 2018-03-16 DIAGNOSIS — R42 Dizziness and giddiness: Secondary | ICD-10-CM | POA: Diagnosis not present

## 2018-03-16 DIAGNOSIS — E039 Hypothyroidism, unspecified: Secondary | ICD-10-CM

## 2018-03-16 DIAGNOSIS — R001 Bradycardia, unspecified: Secondary | ICD-10-CM | POA: Diagnosis not present

## 2018-03-16 DIAGNOSIS — H8301 Labyrinthitis, right ear: Secondary | ICD-10-CM | POA: Diagnosis not present

## 2018-03-16 DIAGNOSIS — R1110 Vomiting, unspecified: Secondary | ICD-10-CM | POA: Insufficient documentation

## 2018-03-16 LAB — PROTIME-INR
INR: 0.98
Prothrombin Time: 12.9 seconds (ref 11.4–15.2)

## 2018-03-16 LAB — URINALYSIS, ROUTINE W REFLEX MICROSCOPIC
Bilirubin Urine: NEGATIVE
Glucose, UA: NEGATIVE mg/dL
Hgb urine dipstick: NEGATIVE
Ketones, ur: NEGATIVE mg/dL
Leukocytes, UA: NEGATIVE
Nitrite: NEGATIVE
Protein, ur: NEGATIVE mg/dL
Specific Gravity, Urine: 1.036 — ABNORMAL HIGH (ref 1.005–1.030)
pH: 7 (ref 5.0–8.0)

## 2018-03-16 LAB — DIFFERENTIAL
Abs Immature Granulocytes: 0 10*3/uL (ref 0.0–0.1)
Basophils Absolute: 0.1 10*3/uL (ref 0.0–0.1)
Basophils Relative: 1 %
Eosinophils Absolute: 0.1 10*3/uL (ref 0.0–0.7)
Eosinophils Relative: 2 %
Immature Granulocytes: 0 %
Lymphocytes Relative: 29 %
Lymphs Abs: 2.4 10*3/uL (ref 0.7–4.0)
Monocytes Absolute: 0.7 10*3/uL (ref 0.1–1.0)
Monocytes Relative: 8 %
Neutro Abs: 5 10*3/uL (ref 1.7–7.7)
Neutrophils Relative %: 60 %

## 2018-03-16 LAB — I-STAT TROPONIN, ED: Troponin i, poc: 0.01 ng/mL (ref 0.00–0.08)

## 2018-03-16 LAB — BASIC METABOLIC PANEL
Anion gap: 8 (ref 5–15)
BUN: 17 mg/dL (ref 6–20)
CO2: 25 mmol/L (ref 22–32)
Calcium: 8.7 mg/dL — ABNORMAL LOW (ref 8.9–10.3)
Chloride: 107 mmol/L (ref 98–111)
Creatinine, Ser: 1.01 mg/dL (ref 0.61–1.24)
GFR calc Af Amer: >60 mL/min (ref >60)
GFR calc non Af Amer: >60 mL/min (ref >60)
Glucose, Bld: 149 mg/dL — ABNORMAL HIGH (ref 70–99)
Potassium: 3.6 mmol/L (ref 3.5–5.1)
Sodium: 140 mmol/L (ref 135–145)

## 2018-03-16 LAB — PHOSPHORUS: Phosphorus: 2.7 mg/dL (ref 2.5–4.6)

## 2018-03-16 LAB — CBC
HCT: 44.4 % (ref 39.0–52.0)
Hemoglobin: 14.6 g/dL (ref 13.0–17.0)
MCH: 28.5 pg (ref 26.0–34.0)
MCHC: 32.9 g/dL (ref 30.0–36.0)
MCV: 86.7 fL (ref 78.0–100.0)
Platelets: 259 10*3/uL (ref 150–400)
RBC: 5.12 MIL/uL (ref 4.22–5.81)
RDW: 12.7 % (ref 11.5–15.5)
WBC: 8.3 10*3/uL (ref 4.0–10.5)

## 2018-03-16 LAB — RAPID URINE DRUG SCREEN, HOSP PERFORMED
Amphetamines: NOT DETECTED
Barbiturates: NOT DETECTED
Benzodiazepines: NOT DETECTED
Cocaine: NOT DETECTED
Opiates: NOT DETECTED
Tetrahydrocannabinol: NOT DETECTED

## 2018-03-16 LAB — MAGNESIUM: Magnesium: 2.1 mg/dL (ref 1.7–2.4)

## 2018-03-16 LAB — ETHANOL: Alcohol, Ethyl (B): <10 mg/dL (ref <10)

## 2018-03-16 LAB — APTT: aPTT: 23 seconds — ABNORMAL LOW (ref 24–36)

## 2018-03-16 LAB — CBG MONITORING, ED: Glucose-Capillary: 141 mg/dL — ABNORMAL HIGH (ref 70–99)

## 2018-03-16 MED ORDER — PREDNISONE 50 MG PO TABS: 60.0000 mg | ORAL_TABLET | Freq: Every day | ORAL | Status: DC

## 2018-03-16 MED ORDER — IOPAMIDOL (ISOVUE-370) INJECTION 76%
50.0000 mL | Freq: Once | INTRAVENOUS | Status: AC | PRN
  Administered 2018-03-16: 50 mL via INTRAVENOUS

## 2018-03-16 MED ORDER — LORAZEPAM 2 MG/ML IJ SOLN
1.0000 mg | Freq: Once | INTRAMUSCULAR | Status: AC
  Administered 2018-03-16: 1 mg via INTRAVENOUS
  Filled 2018-03-16: qty 1

## 2018-03-16 MED ORDER — LORAZEPAM 2 MG/ML IJ SOLN
INTRAMUSCULAR | Status: AC
  Filled 2018-03-16: qty 1

## 2018-03-16 MED ORDER — SODIUM CHLORIDE 0.9% FLUSH: 3.0000 mL | INTRAVENOUS | Status: DC | PRN

## 2018-03-16 MED ORDER — SENNOSIDES-DOCUSATE SODIUM 8.6-50 MG PO TABS: 1.0000 | ORAL_TABLET | Freq: Every evening | ORAL | Status: DC | PRN

## 2018-03-16 MED ORDER — ACETAMINOPHEN 325 MG PO TABS: 650.0000 mg | ORAL_TABLET | Freq: Four times a day (QID) | ORAL | Status: DC | PRN

## 2018-03-16 MED ORDER — MECLIZINE HCL 25 MG PO TABS: 25.0000 mg | ORAL_TABLET | Freq: Three times a day (TID) | ORAL | 0 refills | Status: AC

## 2018-03-16 MED ORDER — SODIUM CHLORIDE 0.9% FLUSH: 3.0000 mL | Freq: Two times a day (BID) | INTRAVENOUS | Status: DC

## 2018-03-16 MED ORDER — POTASSIUM CHLORIDE IN NACL 20-0.9 MEQ/L-% IV SOLN
INTRAVENOUS | Status: DC
  Administered 2018-03-16: 07:00:00 via INTRAVENOUS
  Filled 2018-03-16 (×2): qty 1000

## 2018-03-16 MED ORDER — ENOXAPARIN SODIUM 40 MG/0.4ML ~~LOC~~ SOLN
40.0000 mg | Freq: Every day | SUBCUTANEOUS | Status: DC
  Administered 2018-03-16: 40 mg via SUBCUTANEOUS
  Filled 2018-03-16: qty 0.4

## 2018-03-16 MED ORDER — ONDANSETRON HCL 4 MG PO TABS: 4.0000 mg | ORAL_TABLET | Freq: Four times a day (QID) | ORAL | Status: DC | PRN

## 2018-03-16 MED ORDER — PREDNISONE 50 MG PO TABS
60.0000 mg | ORAL_TABLET | Freq: Every day | ORAL | Status: DC
  Administered 2018-03-16: 60 mg via ORAL
  Filled 2018-03-16: qty 1

## 2018-03-16 MED ORDER — ONDANSETRON HCL 4 MG/2ML IJ SOLN
4.0000 mg | Freq: Once | INTRAMUSCULAR | Status: AC
  Administered 2018-03-16: 4 mg via INTRAVENOUS

## 2018-03-16 MED ORDER — PREDNISONE 10 MG PO TABS: ORAL_TABLET | ORAL | 0 refills | Status: AC

## 2018-03-16 MED ORDER — ONDANSETRON HCL 4 MG PO TABS: 4.0000 mg | ORAL_TABLET | Freq: Four times a day (QID) | ORAL | 0 refills | Status: AC | PRN

## 2018-03-16 MED ORDER — ONDANSETRON HCL 4 MG/2ML IJ SOLN: 4.0000 mg | Freq: Four times a day (QID) | INTRAMUSCULAR | Status: DC | PRN

## 2018-03-16 MED ORDER — DIAZEPAM 2 MG PO TABS: 2.0000 mg | ORAL_TABLET | Freq: Three times a day (TID) | ORAL | Status: DC | PRN

## 2018-03-16 MED ORDER — ACETAMINOPHEN 650 MG RE SUPP: 650.0000 mg | Freq: Four times a day (QID) | RECTAL | Status: DC | PRN

## 2018-03-16 MED ORDER — SODIUM CHLORIDE 0.9 % IV SOLN: 250.0000 mL | INTRAVENOUS | Status: DC | PRN

## 2018-03-16 MED ORDER — ONDANSETRON HCL 4 MG/2ML IJ SOLN
INTRAMUSCULAR | Status: AC
  Filled 2018-03-16: qty 2

## 2018-03-16 MED ORDER — LORAZEPAM 2 MG/ML IJ SOLN
1.0000 mg | Freq: Once | INTRAMUSCULAR | Status: AC | PRN
  Administered 2018-03-16 (×2): 1 mg via INTRAVENOUS

## 2018-03-16 MED ORDER — HYDROCODONE-ACETAMINOPHEN 5-325 MG PO TABS: 1.0000 | ORAL_TABLET | ORAL | Status: DC | PRN

## 2018-03-16 MED ORDER — LORAZEPAM 2 MG/ML IJ SOLN
INTRAMUSCULAR | Status: AC
  Administered 2018-03-16: 1 mg via INTRAVENOUS
  Filled 2018-03-16: qty 1

## 2018-03-16 MED ORDER — MECLIZINE HCL 25 MG PO TABS
50.0000 mg | ORAL_TABLET | Freq: Once | ORAL | Status: AC
  Administered 2018-03-16: 50 mg via ORAL
  Filled 2018-03-16: qty 2

## 2018-03-16 MED ORDER — MECLIZINE HCL 25 MG PO TABS
25.0000 mg | ORAL_TABLET | Freq: Three times a day (TID) | ORAL | Status: DC
  Administered 2018-03-16 (×2): 25 mg via ORAL
  Filled 2018-03-16 (×2): qty 1

## 2018-03-16 NOTE — ED Notes (Signed)
Upon Pt assessment pt began to have an episode of bradycardia at 20bpm. Pt then began to vomit. Pt placed on Zoll and pads, EDP aware, brought to bedside at that time.

## 2018-03-16 NOTE — Discharge Summary (Signed)
Physician Discharge Summary  Sean Alvarez NTZ:[id number] DOB: 1961/01/13 DOA: 03/15/2018  PCP: Patient, No Pcp Per  Admit date: 03/15/2018 Discharge date: 03/16/2018  Admitted From: Home  Disposition:  Home   Recommendations for Outpatient Follow-up:  1. Follow up with PCP in 1-2 weeks 2.   Consider TSH if not done recently    Home Health: None  Equipment/Devices: None  Discharge Condition: Good  CODE STATUS: FULL Diet recommendation: Regular  Brief/Interim Summary: Mr. Sean Alvarez is a 57 y.o. M with hypothyroidism who presents with severe intermittent vertigo.  Symptoms presented with waxing, punctuated course, severe by the time of presentation to Emergency Department.  In ER, HINTs was equivocal and MRI was obtained that showed no stroke.  He reported no ear fullness or hearing loss, but recent HSV flare and also tinnitus, and it was thought likely to be from vestibular neuritis.        Discharge Diagnoses:  Labrynthitis Started on prednisone taper.  Treated with ondansetron and lorazepam and meclizine with some relief.  Trained in vestibular rehab and referred to Neuro PT for further vestibular rehab.    Comfortable and able to take PO.   Bradycardia Patient had brief, transient episodes of 1st and 2nd degree heart block only while vomiting.  These were almost certainly vagal in character. No further work up necessary.           Discharge Instructions  Discharge Instructions    Diet general   Complete by:  As directed    Discharge instructions   Complete by:  As directed    From Dr. [name]: You were admitted with vertigo. Given your ear ringing, we believe this was from vestibular neuritis.  (This refers to inflammation of the inner ear, for various reasons). One treatment that may be effective for this form of vertigo is steroids. For this reason, take prednisone according to the following taper:     Take prednisone 60 mg (6 tabs) once daily for 4 more  days (Weds through Sat), then      Take prednisone 40 mg (4 tabs) on day 6 (Sunday)     Take prednisone 30 mg (3 tabs) on day 7     Take prednisone 20 mg (2 tabs) on day 8     Take prednisone 10 mg (1 tab) on day 9     Take prednisone 5 mg (1/2 tab) on day 10 (Thursday) then stop   For nausea, take ondansetron/Zofran 4 mg up to every 6 hours  For dizziness, you may take meclizine 25 mg up to every 6 hours   Have Dr. Ronnald Ramp check your thyroid level at your next visit   Increase activity slowly   Complete by:  As directed      Allergies as of 03/16/2018      Reactions   Shellfish Allergy Anaphylaxis   Bee Venom Swelling   Compazine [prochlorperazine Edisylate] Nausea And Vomiting   Convulsions      Medication List    TAKE these medications   doxycycline 100 MG tablet Commonly known as:  VIBRA-TABS Take 100 mg by mouth 2 (two) times daily.   EPINEPHrine 0.3 mg/0.3 mL Soaj injection Commonly known as:  EPI-PEN Inject 0.3 mg into the muscle daily as needed (allergic reaction).   meclizine 25 MG tablet Commonly known as:  ANTIVERT Take 1 tablet (25 mg total) by mouth every 8 (eight) hours.   ondansetron 4 MG tablet Commonly known as:  ZOFRAN Take 1 tablet (4  mg total) by mouth every 6 (six) hours as needed for nausea.   predniSONE 10 MG tablet Commonly known as:  DELTASONE As directed in taper.   SYNTHROID 75 MCG tablet Generic drug:  levothyroxine Take 75 mcg by mouth daily.      Follow-up Information    [facility].   Specialty:  Rehabilitation Why:  They will call you for a start up date and time for your Physical Therapy Contact information: [phone] [place] [id number] [phone]         Allergies  Allergen Reactions  . Shellfish Allergy Anaphylaxis  . Bee Venom Swelling  . Compazine [Prochlorperazine Edisylate] Nausea And Vomiting    Convulsions     Consultations:  Neurology   Procedures/Studies: Ct Angio Head W Or Wo Contrast  Result Date: 03/16/2018 CLINICAL DATA:  Dizziness and nausea beginning at 2 today. EXAM: CT ANGIOGRAPHY HEAD AND NECK TECHNIQUE: Multidetector CT imaging of the head and neck was performed using the standard protocol during bolus administration of intravenous contrast. Multiplanar CT image reconstructions and MIPs were obtained to evaluate the vascular anatomy. Carotid stenosis measurements (when applicable) are obtained utilizing NASCET criteria, using the distal internal carotid diameter as the denominator. CONTRAST:  9mL ISOVUE-370 IOPAMIDOL (ISOVUE-370) INJECTION 76% COMPARISON:  None. FINDINGS: CT HEAD FINDINGS BRAIN: No intraparenchymal hemorrhage, mass effect nor midline shift. No hydrocephalus. Patchy supratentorial white matter hypodensities, mild ex vacuo dilatation RIGHT lateral ventricle. Mild parenchymal brain volume loss. No acute large vascular territory infarcts. No abnormal extra-axial fluid collections. Basal cisterns are patent. VASCULAR: Trace calcific atherosclerosis of the carotid siphons. SKULL: No skull fracture. No significant scalp soft tissue swelling. 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 Probable antrectomy and sphenoid sinus surgery with mucoperiosteal reaction. Mastoid air cells are well aerated.The included ocular globes and orbital contents are non-suspicious. OTHER: None. CTA NECK FINDINGS: AORTIC ARCH: Normal appearance of the thoracic arch, mild intimal thickening. Normal branch pattern. The origins of the innominate, left Common carotid artery and subclavian artery are widely patent. RIGHT CAROTID SYSTEM: Common carotid artery is patent. Mild intimal thickening of the carotid bifurcation without hemodynamically significant stenosis by NASCET criteria. Normal appearance of the internal carotid artery. LEFT CAROTID SYSTEM: Common carotid artery is patent.  Normal appearance of the carotid bifurcation without hemodynamically significant stenosis by NASCET criteria. Normal appearance of the internal carotid artery. VERTEBRAL ARTERIES:Codominant vertebral arteries. Normal appearance of the vertebral arteries, widely patent. SKELETON: No acute osseous process though bone windows have not been submitted. Degenerative change of the cervical spine. Moderate C3-4 and C5-6 neural foraminal narrowing, moderate to severe C6-7 neural foraminal narrowing. OTHER NECK: Soft tissues of the neck are nonacute though, not tailored for evaluation. UPPER CHEST: Included lung apices are clear. No superior mediastinal lymphadenopathy. CTA HEAD FINDINGS: ANTERIOR CIRCULATION: Patent cervical internal carotid arteries, petrous, cavernous and supra clinoid internal carotid arteries. Patent anterior communicating artery. Patent anterior and middle cerebral arteries. Minimal stenosis LEFT M1 inferior division compatible with atherosclerosis. No large vessel occlusion, significant stenosis, contrast extravasation or aneurysm. POSTERIOR CIRCULATION: Patent vertebral arteries, vertebrobasilar junction and basilar artery, as well as main branch vessels. Patent posterior cerebral arteries. No large vessel occlusion, significant stenosis, contrast extravasation or aneurysm. VENOUS SINUSES: Major dural venous sinuses are patent though not tailored for evaluation on this angiographic examination. ANATOMIC VARIANTS: None. DELAYED PHASE: No abnormal intracranial enhancement. MIP images reviewed. IMPRESSION: CT HEAD: 1. No acute intracranial process.  2. Mild parenchymal brain volume loss and mild chronic small vessel ischemic changes. 3. Chronic paranasal sinusitis. CTA NECK: 1. No hemodynamically significant stenosis ICA's. Patent vertebral arteries. 2. Moderate to severe C6-7 neural foraminal narrowing. CTA HEAD: 1. No emergent large vessel occlusion or flow-limiting stenosis. Electronically Signed   By:  Elon Alas M.D.   On: 03/16/2018 01:31   Ct Angio Neck W And/or Wo Contrast  Result Date: 03/16/2018 CLINICAL DATA:  Dizziness and nausea beginning at 2 today. EXAM: CT ANGIOGRAPHY HEAD AND NECK TECHNIQUE: Multidetector CT imaging of the head and neck was performed using the standard protocol during bolus administration of intravenous contrast. Multiplanar CT image reconstructions and MIPs were obtained to evaluate the vascular anatomy. Carotid stenosis measurements (when applicable) are obtained utilizing NASCET criteria, using the distal internal carotid diameter as the denominator. CONTRAST:  21mL ISOVUE-370 IOPAMIDOL (ISOVUE-370) INJECTION 76% COMPARISON:  None. FINDINGS: CT HEAD FINDINGS BRAIN: No intraparenchymal hemorrhage, mass effect nor midline shift. No hydrocephalus. Patchy supratentorial white matter hypodensities, mild ex vacuo dilatation RIGHT lateral ventricle. Mild parenchymal brain volume loss. No acute large vascular territory infarcts. No abnormal extra-axial fluid collections. Basal cisterns are patent. VASCULAR: Trace calcific atherosclerosis of the carotid siphons. SKULL: No skull fracture. No significant scalp soft tissue swelling. 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 Probable antrectomy and sphenoid sinus surgery with mucoperiosteal reaction. Mastoid air cells are well aerated.The included ocular globes and orbital contents are non-suspicious. OTHER: None. CTA NECK FINDINGS: AORTIC ARCH: Normal appearance of the thoracic arch, mild intimal thickening. Normal branch pattern. The origins of the innominate, left Common carotid artery and subclavian artery are widely patent. RIGHT CAROTID SYSTEM: Common carotid artery is patent. Mild intimal thickening of the carotid bifurcation without hemodynamically significant stenosis by NASCET criteria. Normal appearance of the internal carotid artery. LEFT CAROTID SYSTEM: Common carotid artery is patent. Normal  appearance of the carotid bifurcation without hemodynamically significant stenosis by NASCET criteria. Normal appearance of the internal carotid artery. VERTEBRAL ARTERIES:Codominant vertebral arteries. Normal appearance of the vertebral arteries, widely patent. SKELETON: No acute osseous process though bone windows have not been submitted. Degenerative change of the cervical spine. Moderate C3-4 and C5-6 neural foraminal narrowing, moderate to severe C6-7 neural foraminal narrowing. OTHER NECK: Soft tissues of the neck are nonacute though, not tailored for evaluation. UPPER CHEST: Included lung apices are clear. No superior mediastinal lymphadenopathy. CTA HEAD FINDINGS: ANTERIOR CIRCULATION: Patent cervical internal carotid arteries, petrous, cavernous and supra clinoid internal carotid arteries. Patent anterior communicating artery. Patent anterior and middle cerebral arteries. Minimal stenosis LEFT M1 inferior division compatible with atherosclerosis. No large vessel occlusion, significant stenosis, contrast extravasation or aneurysm. POSTERIOR CIRCULATION: Patent vertebral arteries, vertebrobasilar junction and basilar artery, as well as main branch vessels. Patent posterior cerebral arteries. No large vessel occlusion, significant stenosis, contrast extravasation or aneurysm. VENOUS SINUSES: Major dural venous sinuses are patent though not tailored for evaluation on this angiographic examination. ANATOMIC VARIANTS: None. DELAYED PHASE: No abnormal intracranial enhancement. MIP images reviewed. IMPRESSION: CT HEAD: 1. No acute intracranial process. 2. Mild parenchymal brain volume loss and mild chronic small vessel ischemic changes. 3. Chronic paranasal sinusitis. CTA NECK: 1. No hemodynamically significant stenosis ICA's. Patent vertebral arteries. 2. Moderate to severe C6-7 neural foraminal narrowing. CTA HEAD: 1. No emergent large vessel occlusion or flow-limiting stenosis. Electronically Signed   By:  Elon Alas M.D.   On: 03/16/2018 01:31   Mr Brain Wo Contrast  Result Date: 03/16/2018 CLINICAL DATA:  Dizziness and vomiting since 4 p.m. Symptoms progressively worsening, now severe. Suspect stroke. EXAM: MRI HEAD WITHOUT CONTRAST TECHNIQUE: Multiplanar, multiecho pulse sequences of the brain and surrounding structures were obtained without intravenous contrast. COMPARISON:  CT HEAD April 12, 2018 FINDINGS: INTRACRANIAL CONTENTS: No reduced diffusion to suggest acute ischemia or hyperacute demyelination. No susceptibility artifact to suggest hemorrhage. Borderline parenchymal brain volume loss for age. 12 mm RIGHT periventricular ovoid FLAIR T2 hyperintensity with low T1 signal suggestive black holes of demyelination. 3 mm T2 hyperintensity RIGHT cerebellum equivocal for demyelination. A few additional subcentimeter scattered predominantly subcortical white matter FLAIR T2 hyperintensities. No suspicious parenchymal signal, masses, mass effect. No abnormal extra-axial fluid collections. No extra-axial masses. VASCULAR: Normal major intracranial vascular flow voids present at skull base. SKULL AND UPPER CERVICAL SPINE: No abnormal sellar expansion. No suspicious calvarial bone marrow signal. Craniocervical junction maintained. SINUSES/ORBITS: Severe chronic RIGHT maxillary sinusitis with atresia. Included ocular globes and orbital contents are non-suspicious. OTHER: None. IMPRESSION: 1. No acute intracranial process. 2. 12 mm RIGHT frontal white matter lesion with imaging characteristics of chronic demyelination. 3. Additional white matter changes more characteristics of mild chronic small vessel ischemic, less likely demyelination. 4. Borderline parenchymal brain volume loss for age. Electronically Signed   By: Elon Alas M.D.   On: 03/16/2018 03:18       Subjective: Feeling well.  Appetite okay.  Vertigo noted, but not severe.  No focal weakness, numbness, slurred speech.  Discharge  Exam: Vitals:   03/16/18 0650 03/16/18 1442  BP: 130/83 (!) 148/81  Pulse: (!) 58 72  Resp: 20 18  Temp: 97.8 F (36.6 C) 98.2 F (36.8 C)  SpO2: 97% 92%   Vitals:   03/16/18 0530 03/16/18 0600 03/16/18 0650 03/16/18 1442  BP: 124/87 (!) 143/90 130/83 (!) 148/81  Pulse: (!) 59 62 (!) 58 72  Resp: 15 17 20 18   Temp:   97.8 F (36.6 C) 98.2 F (36.8 C)  TempSrc:    Oral  SpO2: 95% 92% 97% 92%  Weight:   93.6 kg   Height:   5\' 10"  (1.778 m)     General: Pt is alert, awake, not in acute distress Cardiovascular: RRR, S1/S2 +, no rubs, no gallops Respiratory: CTA bilaterally, no wheezing, no rhonchi Abdominal: Soft, NT, ND, bowel sounds + Extremities: no edema, no cyanosis    The results of significant diagnostics from this hospitalization (including imaging, microbiology, ancillary and laboratory) are listed below for reference.     Microbiology: No results found for this or any previous visit (from the past 240 hour(s)).   Labs: BNP (last 3 results) No results for input(s): BNP in the last 8760 hours. Basic Metabolic Panel: Recent Labs  Lab 03/16/18 0027  NA 140  K 3.6  CL 107  CO2 25  GLUCOSE 149*  BUN 17  CREATININE 1.01  CALCIUM 8.7*  MG 2.1  PHOS 2.7   Liver Function Tests: No results for input(s): AST, ALT, ALKPHOS, BILITOT, PROT, ALBUMIN in the last 168 hours. No results for input(s): LIPASE, AMYLASE in the last 168 hours. No results for input(s): AMMONIA in the last 168 hours. CBC: Recent Labs  Lab 03/16/18 0027  WBC 8.3  NEUTROABS 5.0  HGB 14.6  HCT 44.4  MCV 86.7  PLT 259   Cardiac Enzymes: No results for input(s): CKTOTAL, CKMB, CKMBINDEX, TROPONINI in the last 168 hours. BNP: Invalid input(s): POCBNP CBG: Recent Labs  Lab 03/16/18 0041  GLUCAP 141*   D-Dimer  No results for input(s): DDIMER in the last 72 hours. Hgb A1c No results for input(s): HGBA1C in the last 72 hours. Lipid Profile No results for input(s): CHOL, HDL,  LDLCALC, TRIG, CHOLHDL, LDLDIRECT in the last 72 hours. Thyroid function studies No results for input(s): TSH, T4TOTAL, T3FREE, THYROIDAB in the last 72 hours.  Invalid input(s): FREET3 Anemia work up No results for input(s): VITAMINB12, FOLATE, FERRITIN, TIBC, IRON, RETICCTPCT in the last 72 hours. Urinalysis    Component Value Date/Time   COLORURINE YELLOW 03/16/2018 0622   APPEARANCEUR CLEAR 03/16/2018 0622   LABSPEC 1.036 (H) 03/16/2018 0622   PHURINE 7.0 03/16/2018 0622   GLUCOSEU NEGATIVE 03/16/2018 0622   HGBUR NEGATIVE 03/16/2018 0622   BILIRUBINUR NEGATIVE 03/16/2018 0622   KETONESUR NEGATIVE 03/16/2018 0622   PROTEINUR NEGATIVE 03/16/2018 0622   NITRITE NEGATIVE 03/16/2018 0622   LEUKOCYTESUR NEGATIVE 03/16/2018 0622   Sepsis Labs Invalid input(s): PROCALCITONIN,  WBC,  LACTICIDVEN Microbiology No results found for this or any previous visit (from the past 240 hour(s)).   Time coordinating discharge: 40 minutes       SIGNED:   Edwin Dada, MD  [facility] 03/16/2018, 9:02 PM

## 2018-03-16 NOTE — ED Provider Notes (Addendum)
[facility] EMERGENCY DEPARTMENT Provider Note   CSN: [id number] Arrival date & time: 03/15/18  2344     History   Chief Complaint Chief Complaint  Patient presents with  . Dizziness  . Emesis    HPI Sean Alvarez is a 57 y.o. male.  HPI  57 year old male comes in with chief complaint of dizziness and vomiting. Patient reports that he has been having dizziness since 4 PM.  Dizziness is described as spinning sensation, with nausea.  Patient's symptoms initially were intermittent.  He went to bed around 10 PM and woke up few minutes prior to ED arrival.  When patient woke up his symptoms were severe and he was unable to even get out of the bed therefore he called EMS.  Review of system is positive for neck discomfort that started around 10 PM.  Patient denies any chest pain, shortness of breath, vision changes, headache, focal numbness, weakness or tingling, slurred speech.  Patient is taking doxycycline and had a flu shot earlier today.  He denies any URI-like symptoms, however does mention having intermittent episodes of ringing in his ears.  He has had vertigo in the past, however the current symptoms are more intense and more constant.  Patient denies any substance abuse, heavy alcohol use, recent trauma.  Past Medical History:  Diagnosis Date  . Asthma     There are no active problems to display for this patient.   Past Surgical History:  Procedure Laterality Date  . HERNIA REPAIR    . TONSILLECTOMY          Home Medications    Prior to Admission medications   Not on File    Family History No family history on file.  Social History Social History   Tobacco Use  . Smoking status: Never Smoker  . Smokeless tobacco: Never Used  Substance Use Topics  . Alcohol use: Yes    Alcohol/week: 4.0 - 6.0 standard drinks    Types: 4 - 6 Cans of beer per week  . Drug use: Never     Allergies   Compazine [prochlorperazine  edisylate]   Review of Systems Review of Systems  Constitutional: Positive for activity change and diaphoresis.  Eyes: Negative for visual disturbance.  Respiratory: Negative for shortness of breath.   Cardiovascular: Negative for chest pain.  Gastrointestinal: Positive for nausea and vomiting.  Neurological: Positive for dizziness. Negative for seizures, syncope, speech difficulty, weakness, numbness and headaches.  All other systems reviewed and are negative.    Physical Exam Updated Vital Signs BP (!) 157/101   Pulse (!) 102   Temp 97.7 F (36.5 C)   Resp 17   Ht 5\' 10"  (1.778 m)   Wt 93 kg   SpO2 96%   BMI 29.41 kg/m   Physical Exam  Constitutional: He is oriented to person, place, and time. He appears well-developed.  HENT:  Head: Atraumatic.  Eyes: Pupils are equal, round, and reactive to light.  Saccadic eye movement with bilateral gaze Patient was unable to tolerate head impulse test.  Neck: Neck supple.  Cardiovascular: Normal rate.  Pulmonary/Chest: Effort normal.  Neurological: He is alert and oriented to person, place, and time.  Skin: Skin is warm.  Nursing note and vitals reviewed.    ED Treatments / Results  Labs (all labs ordered are listed, but only abnormal results are displayed) Labs Reviewed  BASIC METABOLIC PANEL - Abnormal; Notable for the following components:      Result  Value   Glucose, Bld 149 (*)    Calcium 8.7 (*)    All other components within normal limits  APTT - Abnormal; Notable for the following components:   aPTT 23 (*)    All other components within normal limits  CBG MONITORING, ED - Abnormal; Notable for the following components:   Glucose-Capillary 141 (*)    All other components within normal limits  CBC  MAGNESIUM  PHOSPHORUS  ETHANOL  PROTIME-INR  DIFFERENTIAL  RAPID URINE DRUG SCREEN, HOSP PERFORMED  URINALYSIS, ROUTINE W REFLEX MICROSCOPIC  I-STAT TROPONIN, ED  I-STAT TROPONIN, ED    EKG EKG  Interpretation  Date/Time:  Monday March 15 2018 23:49:38 EDT Ventricular Rate:  53 PR Interval:    QRS Duration: 108 QT Interval:  463 QTC Calculation: 435 R Axis:   57 Text Interpretation:  Sinus rhythm No acute changes normal intervals No old tracing to compare Confirmed by Varney Biles [phone]) on 03/16/2018 12:17:30 AM     Radiology Ct Angio Head W Or Wo Contrast  Result Date: 03/16/2018 CLINICAL DATA:  Dizziness and nausea beginning at 2 today. EXAM: CT ANGIOGRAPHY HEAD AND NECK TECHNIQUE: Multidetector CT imaging of the head and neck was performed using the standard protocol during bolus administration of intravenous contrast. Multiplanar CT image reconstructions and MIPs were obtained to evaluate the vascular anatomy. Carotid stenosis measurements (when applicable) are obtained utilizing NASCET criteria, using the distal internal carotid diameter as the denominator. CONTRAST:  92mL ISOVUE-370 IOPAMIDOL (ISOVUE-370) INJECTION 76% COMPARISON:  None. FINDINGS: CT HEAD FINDINGS BRAIN: No intraparenchymal hemorrhage, mass effect nor midline shift. No hydrocephalus. Patchy supratentorial white matter hypodensities, mild ex vacuo dilatation RIGHT lateral ventricle. Mild parenchymal brain volume loss. No acute large vascular territory infarcts. No abnormal extra-axial fluid collections. Basal cisterns are patent. VASCULAR: Trace calcific atherosclerosis of the carotid siphons. SKULL: No skull fracture. No significant scalp soft tissue swelling. 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 Probable antrectomy and sphenoid sinus surgery with mucoperiosteal reaction. Mastoid air cells are well aerated.The included ocular globes and orbital contents are non-suspicious. OTHER: None. CTA NECK FINDINGS: AORTIC ARCH: Normal appearance of the thoracic arch, mild intimal thickening. Normal branch pattern. The origins of the innominate, left Common carotid artery and subclavian  artery are widely patent. RIGHT CAROTID SYSTEM: Common carotid artery is patent. Mild intimal thickening of the carotid bifurcation without hemodynamically significant stenosis by NASCET criteria. Normal appearance of the internal carotid artery. LEFT CAROTID SYSTEM: Common carotid artery is patent. Normal appearance of the carotid bifurcation without hemodynamically significant stenosis by NASCET criteria. Normal appearance of the internal carotid artery. VERTEBRAL ARTERIES:Codominant vertebral arteries. Normal appearance of the vertebral arteries, widely patent. SKELETON: No acute osseous process though bone windows have not been submitted. Degenerative change of the cervical spine. Moderate C3-4 and C5-6 neural foraminal narrowing, moderate to severe C6-7 neural foraminal narrowing. OTHER NECK: Soft tissues of the neck are nonacute though, not tailored for evaluation. UPPER CHEST: Included lung apices are clear. No superior mediastinal lymphadenopathy. CTA HEAD FINDINGS: ANTERIOR CIRCULATION: Patent cervical internal carotid arteries, petrous, cavernous and supra clinoid internal carotid arteries. Patent anterior communicating artery. Patent anterior and middle cerebral arteries. Minimal stenosis LEFT M1 inferior division compatible with atherosclerosis. No large vessel occlusion, significant stenosis, contrast extravasation or aneurysm. POSTERIOR CIRCULATION: Patent vertebral arteries, vertebrobasilar junction and basilar artery, as well as main branch vessels. Patent posterior cerebral arteries. No large vessel occlusion, significant stenosis, contrast extravasation or aneurysm. VENOUS SINUSES: Major  dural venous sinuses are patent though not tailored for evaluation on this angiographic examination. ANATOMIC VARIANTS: None. DELAYED PHASE: No abnormal intracranial enhancement. MIP images reviewed. IMPRESSION: CT HEAD: 1. No acute intracranial process. 2. Mild parenchymal brain volume loss and mild chronic small  vessel ischemic changes. 3. Chronic paranasal sinusitis. CTA NECK: 1. No hemodynamically significant stenosis ICA's. Patent vertebral arteries. 2. Moderate to severe C6-7 neural foraminal narrowing. CTA HEAD: 1. No emergent large vessel occlusion or flow-limiting stenosis. Electronically Signed   By: Elon Alas M.D.   On: 03/16/2018 01:31   Ct Angio Neck W And/or Wo Contrast  Result Date: 03/16/2018 CLINICAL DATA:  Dizziness and nausea beginning at 2 today. EXAM: CT ANGIOGRAPHY HEAD AND NECK TECHNIQUE: Multidetector CT imaging of the head and neck was performed using the standard protocol during bolus administration of intravenous contrast. Multiplanar CT image reconstructions and MIPs were obtained to evaluate the vascular anatomy. Carotid stenosis measurements (when applicable) are obtained utilizing NASCET criteria, using the distal internal carotid diameter as the denominator. CONTRAST:  35mL ISOVUE-370 IOPAMIDOL (ISOVUE-370) INJECTION 76% COMPARISON:  None. FINDINGS: CT HEAD FINDINGS BRAIN: No intraparenchymal hemorrhage, mass effect nor midline shift. No hydrocephalus. Patchy supratentorial white matter hypodensities, mild ex vacuo dilatation RIGHT lateral ventricle. Mild parenchymal brain volume loss. No acute large vascular territory infarcts. No abnormal extra-axial fluid collections. Basal cisterns are patent. VASCULAR: Trace calcific atherosclerosis of the carotid siphons. SKULL: No skull fracture. No significant scalp soft tissue swelling. 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 Probable antrectomy and sphenoid sinus surgery with mucoperiosteal reaction. Mastoid air cells are well aerated.The included ocular globes and orbital contents are non-suspicious. OTHER: None. CTA NECK FINDINGS: AORTIC ARCH: Normal appearance of the thoracic arch, mild intimal thickening. Normal branch pattern. The origins of the innominate, left Common carotid artery and subclavian artery  are widely patent. RIGHT CAROTID SYSTEM: Common carotid artery is patent. Mild intimal thickening of the carotid bifurcation without hemodynamically significant stenosis by NASCET criteria. Normal appearance of the internal carotid artery. LEFT CAROTID SYSTEM: Common carotid artery is patent. Normal appearance of the carotid bifurcation without hemodynamically significant stenosis by NASCET criteria. Normal appearance of the internal carotid artery. VERTEBRAL ARTERIES:Codominant vertebral arteries. Normal appearance of the vertebral arteries, widely patent. SKELETON: No acute osseous process though bone windows have not been submitted. Degenerative change of the cervical spine. Moderate C3-4 and C5-6 neural foraminal narrowing, moderate to severe C6-7 neural foraminal narrowing. OTHER NECK: Soft tissues of the neck are nonacute though, not tailored for evaluation. UPPER CHEST: Included lung apices are clear. No superior mediastinal lymphadenopathy. CTA HEAD FINDINGS: ANTERIOR CIRCULATION: Patent cervical internal carotid arteries, petrous, cavernous and supra clinoid internal carotid arteries. Patent anterior communicating artery. Patent anterior and middle cerebral arteries. Minimal stenosis LEFT M1 inferior division compatible with atherosclerosis. No large vessel occlusion, significant stenosis, contrast extravasation or aneurysm. POSTERIOR CIRCULATION: Patent vertebral arteries, vertebrobasilar junction and basilar artery, as well as main branch vessels. Patent posterior cerebral arteries. No large vessel occlusion, significant stenosis, contrast extravasation or aneurysm. VENOUS SINUSES: Major dural venous sinuses are patent though not tailored for evaluation on this angiographic examination. ANATOMIC VARIANTS: None. DELAYED PHASE: No abnormal intracranial enhancement. MIP images reviewed. IMPRESSION: CT HEAD: 1. No acute intracranial process. 2. Mild parenchymal brain volume loss and mild chronic small vessel  ischemic changes. 3. Chronic paranasal sinusitis. CTA NECK: 1. No hemodynamically significant stenosis ICA's. Patent vertebral arteries. 2. Moderate to severe C6-7 neural foraminal narrowing. CTA HEAD: 1.  No emergent large vessel occlusion or flow-limiting stenosis. Electronically Signed   By: Elon Alas M.D.   On: 03/16/2018 01:31   Mr Brain Wo Contrast  Result Date: 03/16/2018 CLINICAL DATA:  Dizziness and vomiting since 4 p.m. Symptoms progressively worsening, now severe. Suspect stroke. EXAM: MRI HEAD WITHOUT CONTRAST TECHNIQUE: Multiplanar, multiecho pulse sequences of the brain and surrounding structures were obtained without intravenous contrast. COMPARISON:  CT HEAD April 12, 2018 FINDINGS: INTRACRANIAL CONTENTS: No reduced diffusion to suggest acute ischemia or hyperacute demyelination. No susceptibility artifact to suggest hemorrhage. Borderline parenchymal brain volume loss for age. 12 mm RIGHT periventricular ovoid FLAIR T2 hyperintensity with low T1 signal suggestive black holes of demyelination. 3 mm T2 hyperintensity RIGHT cerebellum equivocal for demyelination. A few additional subcentimeter scattered predominantly subcortical white matter FLAIR T2 hyperintensities. No suspicious parenchymal signal, masses, mass effect. No abnormal extra-axial fluid collections. No extra-axial masses. VASCULAR: Normal major intracranial vascular flow voids present at skull base. SKULL AND UPPER CERVICAL SPINE: No abnormal sellar expansion. No suspicious calvarial bone marrow signal. Craniocervical junction maintained. SINUSES/ORBITS: Severe chronic RIGHT maxillary sinusitis with atresia. Included ocular globes and orbital contents are non-suspicious. OTHER: None. IMPRESSION: 1. No acute intracranial process. 2. 12 mm RIGHT frontal white matter lesion with imaging characteristics of chronic demyelination. 3. Additional white matter changes more characteristics of mild chronic small vessel ischemic, less  likely demyelination. 4. Borderline parenchymal brain volume loss for age. Electronically Signed   By: Elon Alas M.D.   On: 03/16/2018 03:18    Procedures Procedures (including critical care time)  Medications Ordered in ED Medications  LORazepam (ATIVAN) 2 MG/ML injection (has no administration in time range)  iopamidol (ISOVUE-370) 76 % injection 50 mL (50 mLs Intravenous Contrast Given 03/16/18 0117)  ondansetron (ZOFRAN) injection 4 mg (4 mg Intravenous Given 03/16/18 0225)  LORazepam (ATIVAN) injection 1 mg (1 mg Intravenous Given 03/16/18 0225)  meclizine (ANTIVERT) tablet 50 mg (50 mg Oral Given 03/16/18 0357)  LORazepam (ATIVAN) injection 1 mg (1 mg Intravenous Given 03/16/18 0357)     Initial Impression / Assessment and Plan / ED Course  I have reviewed the triage vital signs and the nursing notes.  Pertinent labs & imaging results that were available during my care of the patient were reviewed by me and considered in my medical decision making (see chart for details).  Clinical Course as of Mar 16 413  Tue Mar 16, 2018  0124 Have not heard from neurology yet.  Code stroke has been activated to get prompt evaluation given the time sensitivity.   [AN]  [id number] Neurology team probably assessed the patient after code stroke activation.  They are recommending a stat MRI.  Patient is not a TPA candidate because of his waxing and waning symptoms.   [AN]  0249 CT angiogram does not reveal any acute process  CT Angio Neck W and/or Wo Contrast [AN]  0249 Results from the ER workup discussed with the patient face to face and all questions answered to the best of my ability.    [AN]  0414 Results from the ER workup discussed with the patient face to face and all questions answered to the best of my ability.  MRI does not show any acute stroke. Patient continues to be symptomatic.  1 more milligram Ativan along with oral Antivert ordered.  We will reassess patient.   MR BRAIN WO  CONTRAST [AN]    Clinical Course User Index [AN] Varney Biles, MD  57 year old male comes in with chief complaint of dizziness.  Patient has constant vertigo that is reproducible with movement of his head.  Patient has nausea and he also complains of neck pain.  Given that patient has constant nausea and neck pain, there is concerns for central etiology for the vertigo.  Patient has intermittent ringing in the ears and his symptoms are worse with movement of the head which makes peripheral vertigo possible as well.  We noted that when patient was turning his head, he had his heart rate dropped into the 20s.  That feature got [place] concern for possible pontine or basilar stroke.  Stat CT angiogram ordered along with neurologic consultation. If Neuro clears then we would consider vasovagal episode as the underlying etiology.  It seems like patient has been having intermittent vertigo since 4 p.m., however he was last normal as far as constant vertigo is concerned that 10 PM.  Final Clinical Impressions(s) / ED Diagnoses   Final diagnoses:  Acute vertigo with vomiting and inability to stand  Acute severe vertigo  Symptomatic bradycardia    ED Discharge Orders    None       Varney Biles, MD 03/16/18 2353    Varney Biles, MD 03/16/18 [phone]

## 2018-03-16 NOTE — Progress Notes (Signed)
Christell Faith to be D/C'd to home with outpatient vestibular therapy per MD order.  Discussed with the patient and all questions fully answered.  VSS, Skin clean, dry and intact without evidence of skin break down, no evidence of skin tears noted. IV catheter discontinued intact. Site without signs and symptoms of complications. Dressing and pressure applied.  An After Visit Summary was printed and given to the patient. Patient  Prescriptions sent to pharmacy.  D/c education completed with patient/family including follow up instructions, medication list, d/c activities limitations if indicated, with other d/c instructions as indicated by MD - patient able to verbalize understanding, all questions fully answered.   Patient instructed to return to ED, call 911, or call MD for any changes in condition.   Patient escorted via [place], and D/C home via private auto.  Morley Kos Price 03/16/2018 4:42 PM

## 2018-03-16 NOTE — Evaluation (Signed)
Occupational Therapy Evaluation Patient Details Name: Sean Alvarez MRN: [id number] DOB: 12-Nov-1960 Today's Date: 03/16/2018    History of Present Illness 57 y.o. male admitted on 03/15/18 for dizziness, N/V.  Pt's MRI was negative for stroke.  Pt did have some episodes of brady cardia with pauses while vomiting.  Pt with other significant PMH of recent antibiotic use for staph skin infection.   Clinical Impression   Pt was admitted for the above.  He had 2 LOB with OT during ADLs/toilet transfers.  He verbalizes understanding of all safety precautions and verbalizes strategies PT gave him.      Follow Up Recommendations  (OP PT vestibular/balance program-)    Equipment Recommendations  None recommended by OT(unless he decides on tub seat)    Recommendations for Other Services       Precautions / Restrictions Precautions Precautions: Fall Precaution Comments: staggers/lists to the right and turning right elicits more staggering than left. Also lost balance forward Restrictions Weight Bearing Restrictions: No      Mobility Bed Mobility Overal bed mobility: Independent                Transfers Overall transfer level: Needs assistance   Transfers: Sit to/from Stand Sit to Stand: Min guard         General transfer comment: Min guard assist for safety during transitions, (+) static sway in standing.     Balance Overall balance assessment: Needs assistance Sitting-balance support: Feet supported;No upper extremity supported Sitting balance-Leahy Scale: Good   Postural control: Posterior lean Standing balance support: No upper extremity supported Standing balance-Leahy Scale: Fair Standing balance comment: close supervision in standing.                            ADL either performed or assessed with clinical judgement   ADL Overall ADL's : Needs assistance/impaired     Grooming: Oral care;Min guard;Standing       Lower Body Bathing: Min  guard;Sit to/from stand       Lower Body Dressing: Min guard;Sit to/from stand   Toilet Transfer: Minimal assistance;Ambulation;Regular Toilet             General ADL Comments: pt needs set up for all seated adls. Lost balance x 2 when ambulating to bathroom.  Forward and to R. Educated on safety:  sitting for adls; using gaitbelt when ambulating with wife holding lightly; sponge bathing.  Of note, pt did not feel dizzy/spinning when he had LOB   Pt did perform ADL during OT session     Vision         Perception     Praxis      Pertinent Vitals/Pain Pain Assessment: No/denies pain Faces Pain Scale: Hurts little more Pain Location: neck soreness Pain Descriptors / Indicators: Aching Pain Intervention(s): Limited activity within patient's tolerance;Monitored during session;Repositioned     Hand Dominance Right   Extremity/Trunk Assessment Upper Extremity Assessment Upper Extremity Assessment: Overall WFL for tasks assessed   Lower Extremity Assessment Lower Extremity Assessment: Overall WFL for tasks assessed   Cervical / Trunk Assessment Cervical / Trunk Assessment: Normal   Communication Communication Communication: No difficulties   Cognition Arousal/Alertness: Awake/alert Behavior During Therapy: WFL for tasks assessed/performed Overall Cognitive Status: Within Functional Limits for tasks assessed  General Comments  Pt performed vestibular eval    Exercises     Shoulder Instructions      Home Living Family/patient expects to be discharged to:: Private residence Living Arrangements: Spouse/significant other Available Help at Discharge: Family;Available 24 hours/day Type of Home: House       Home Layout: Multi-level     Bathroom Shower/Tub: Teacher, early years/pre: Standard     Home Equipment: None   Additional Comments: has a garden tub. Recommended sponge bathing if he doesn't  get a seat      Prior Functioning/Environment Level of Independence: Independent        Comments: works full time at a desk job, drives, normally completely independent.         OT Problem List:        OT Treatment/Interventions:      OT Goals(Current goals can be found in the care plan section) Acute Rehab OT Goals Patient Stated Goal: to return to normal OT Goal Formulation: All assessment and education complete, DC therapy  OT Frequency:     Barriers to D/C:            Co-evaluation              AM-PAC PT "6 Clicks" Daily Activity     Outcome Measure Help from another person eating meals?: None Help from another person taking care of personal grooming?: A Little Help from another person toileting, which includes using toliet, bedpan, or urinal?: A Little Help from another person bathing (including washing, rinsing, drying)?: A Little Help from another person to put on and taking off regular upper body clothing?: A Little Help from another person to put on and taking off regular lower body clothing?: A Little 6 Click Score: 19   End of Session    Activity Tolerance: Patient tolerated treatment well Patient left: in bed(eob with wife and RN)  OT Visit Diagnosis: Unsteadiness on feet (R26.81)                Time: 7616-0737 OT Time Calculation (min): 22 min Charges:  OT General Charges $OT Visit: 1 Visit OT Evaluation $OT Eval Low Complexity: 1 Low  Lesle Chris, OTR/L [phone] 03/16/2018  [place] 03/16/2018, 3:48 PM

## 2018-03-16 NOTE — ED Notes (Signed)
[facility] called to activate code stroke 

## 2018-03-16 NOTE — ED Notes (Signed)
Ambulated pt with two stand by assist, pt with very unsteady gait, c/o of dizziness

## 2018-03-16 NOTE — Code Documentation (Addendum)
Code Stroke  Paged at [facility]  Activated to evaluate patient for stroke/neuro consult. LSN 1600, dizziness/N/V which started at 1600. Patient went to bed and at 2200 woke up and since then the dizziness has been constant/on going. + Vertigo, + Neck pain for a two days.  CTA H/N was already ordered and completed prior to Code Stroke being called. Initial NIH was 1 (facial asymmetry), after re-evaluation was 2 (Limb Ataxia).  Of note, patient has had several episodes of profound pauses and bradycardia and with each episode, patient vomits after. HR drops then patient vomits.   Taken for STAT MRI at [facility]

## 2018-03-16 NOTE — H&P (Signed)
History and Physical    Sean Alvarez QJJ:[id number] DOB: 1961/04/20 DOA: 03/15/2018  PCP: Patient, No Pcp Per   Patient coming from: Home   Chief Complaint: Dizziness, nausea, vomiting  HPI: Sean Alvarez is a 57 y.o. male who denies any significant past medical history, now presenting to the emergency department with dizziness and nausea.  Patient reports that he developed a sensation as though the room was spinning at approximately 4 PM yesterday, laid down to take a nap, had some improvement, but symptoms returned.  Sensation of room spinning and nausea waxed and waned, but woke overnight with symptoms more persistent and severe.  He was unable to get out of bed due to severity of his symptoms and EMS was called.  He was treated with Zofran prior to arrival in the ED.  He denies any recent fall or trauma, denies significant alcohol use, denies illicit substance use, denies any recent fevers or chills.  He does report recent tinnitus.  ED Course: Upon arrival to the ED, patient is found to be afebrile, saturating well on room air, and with vitals otherwise stable.  EKG features a sinus rhythm.  Noncontrast head CT is negative for acute intracranial abnormality and CTA head and neck are negative for emergent large vessel occlusion or hemodynamically significant stenosis.  MRI brain is negative for acute intracranial finding.  CBC and chemistry panel are unremarkable, INR and troponin are normal, and ethanol level is undetectable.  Patient was treated with multiple doses of Ativan, Zofran, and meclizine in the ED.  He continues to have severe symptoms and is unable to stand.  He was noted to have episodes of bradycardia in the setting of nausea with vomiting, with normal rate between these episodes.  Patient will be observed for ongoing evaluation and management of his disabling vertigo.  Review of Systems:  All other systems reviewed and apart from HPI, are negative.  Past Medical  History:  Diagnosis Date  . Asthma     Past Surgical History:  Procedure Laterality Date  . HERNIA REPAIR    . TONSILLECTOMY       reports that he has never smoked. He has never used smokeless tobacco. He reports that he drinks about 4.0 - 6.0 standard drinks of alcohol per week. He reports that he does not use drugs.  Allergies  Allergen Reactions  . Compazine [Prochlorperazine Edisylate] Nausea And Vomiting    Convulsions    No family history on file.   Prior to Admission medications   Not on File    Physical Exam: Vitals:   03/16/18 0415 03/16/18 0430 03/16/18 0445 03/16/18 0500  BP:  122/84  (!) 129/95  Pulse: 60 63 65 64  Resp: 15 15 14 14   Temp:      TempSrc:      SpO2: 96% 95% 94% 95%  Weight:      Height:          Constitutional: NAD, calm, appears uncomfortable, holding emesis bag Eyes: PERTLA, lids and conjunctivae normal ENMT: Mucous membranes are moist. Posterior pharynx clear of any exudate or lesions.   Neck: normal, supple, no masses, no thyromegaly Respiratory: clear to auscultation bilaterally, no wheezing, no crackles. Normal respiratory effort.   Cardiovascular: S1 & S2 heard, regular rate and rhythm. No extremity edema.   Abdomen: No distension, no tenderness, soft. Bowel sounds normal.  Musculoskeletal: no clubbing / cyanosis. No joint deformity upper and lower extremities.   Skin: no significant rashes, lesions, ulcers. Warm,  dry, well-perfused. Neurologic: CN 2-12 grossly intact. Sensation intact. Strength 5/5 in all 4 limbs.  Psychiatric: Alert and oriented x 3. Calm, cooperative.     Labs on Admission: I have personally reviewed following labs and imaging studies  CBC: Recent Labs  Lab 03/16/18 0027  WBC 8.3  NEUTROABS 5.0  HGB 14.6  HCT 44.4  MCV 86.7  PLT 053   Basic Metabolic Panel: Recent Labs  Lab 03/16/18 0027  NA 140  K 3.6  CL 107  CO2 25  GLUCOSE 149*  BUN 17  CREATININE 1.01  CALCIUM 8.7*  MG 2.1  PHOS  2.7   GFR: Estimated Creatinine Clearance: 90.2 mL/min (by C-G formula based on SCr of 1.01 mg/dL). Liver Function Tests: No results for input(s): AST, ALT, ALKPHOS, BILITOT, PROT, ALBUMIN in the last 168 hours. No results for input(s): LIPASE, AMYLASE in the last 168 hours. No results for input(s): AMMONIA in the last 168 hours. Coagulation Profile: Recent Labs  Lab 03/16/18 0027  INR 0.98   Cardiac Enzymes: No results for input(s): CKTOTAL, CKMB, CKMBINDEX, TROPONINI in the last 168 hours. BNP (last 3 results) No results for input(s): PROBNP in the last 8760 hours. HbA1C: No results for input(s): HGBA1C in the last 72 hours. CBG: Recent Labs  Lab 03/16/18 0041  GLUCAP 141*   Lipid Profile: No results for input(s): CHOL, HDL, LDLCALC, TRIG, CHOLHDL, LDLDIRECT in the last 72 hours. Thyroid Function Tests: No results for input(s): TSH, T4TOTAL, FREET4, T3FREE, THYROIDAB in the last 72 hours. Anemia Panel: No results for input(s): VITAMINB12, FOLATE, FERRITIN, TIBC, IRON, RETICCTPCT in the last 72 hours. Urine analysis: No results found for: COLORURINE, APPEARANCEUR, LABSPEC, PHURINE, GLUCOSEU, HGBUR, BILIRUBINUR, KETONESUR, PROTEINUR, UROBILINOGEN, NITRITE, LEUKOCYTESUR Sepsis Labs: @LABRCNTIP (procalcitonin:4,lacticidven:4) )No results found for this or any previous visit (from the past 240 hour(s)).   Radiological Exams on Admission: Ct Angio Head W Or Wo Contrast  Result Date: 03/16/2018 CLINICAL DATA:  Dizziness and nausea beginning at 2 today. EXAM: CT ANGIOGRAPHY HEAD AND NECK TECHNIQUE: Multidetector CT imaging of the head and neck was performed using the standard protocol during bolus administration of intravenous contrast. Multiplanar CT image reconstructions and MIPs were obtained to evaluate the vascular anatomy. Carotid stenosis measurements (when applicable) are obtained utilizing NASCET criteria, using the distal internal carotid diameter as the denominator.  CONTRAST:  72mL ISOVUE-370 IOPAMIDOL (ISOVUE-370) INJECTION 76% COMPARISON:  None. FINDINGS: CT HEAD FINDINGS BRAIN: No intraparenchymal hemorrhage, mass effect nor midline shift. No hydrocephalus. Patchy supratentorial white matter hypodensities, mild ex vacuo dilatation RIGHT lateral ventricle. Mild parenchymal brain volume loss. No acute large vascular territory infarcts. No abnormal extra-axial fluid collections. Basal cisterns are patent. VASCULAR: Trace calcific atherosclerosis of the carotid siphons. SKULL: No skull fracture. No significant scalp soft tissue swelling. 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 Probable antrectomy and sphenoid sinus surgery with mucoperiosteal reaction. Mastoid air cells are well aerated.The included ocular globes and orbital contents are non-suspicious. OTHER: None. CTA NECK FINDINGS: AORTIC ARCH: Normal appearance of the thoracic arch, mild intimal thickening. Normal branch pattern. The origins of the innominate, left Common carotid artery and subclavian artery are widely patent. RIGHT CAROTID SYSTEM: Common carotid artery is patent. Mild intimal thickening of the carotid bifurcation without hemodynamically significant stenosis by NASCET criteria. Normal appearance of the internal carotid artery. LEFT CAROTID SYSTEM: Common carotid artery is patent. Normal appearance of the carotid bifurcation without hemodynamically significant stenosis by NASCET criteria. Normal appearance of the internal carotid artery.  VERTEBRAL ARTERIES:Codominant vertebral arteries. Normal appearance of the vertebral arteries, widely patent. SKELETON: No acute osseous process though bone windows have not been submitted. Degenerative change of the cervical spine. Moderate C3-4 and C5-6 neural foraminal narrowing, moderate to severe C6-7 neural foraminal narrowing. OTHER NECK: Soft tissues of the neck are nonacute though, not tailored for evaluation. UPPER CHEST: Included lung  apices are clear. No superior mediastinal lymphadenopathy. CTA HEAD FINDINGS: ANTERIOR CIRCULATION: Patent cervical internal carotid arteries, petrous, cavernous and supra clinoid internal carotid arteries. Patent anterior communicating artery. Patent anterior and middle cerebral arteries. Minimal stenosis LEFT M1 inferior division compatible with atherosclerosis. No large vessel occlusion, significant stenosis, contrast extravasation or aneurysm. POSTERIOR CIRCULATION: Patent vertebral arteries, vertebrobasilar junction and basilar artery, as well as main branch vessels. Patent posterior cerebral arteries. No large vessel occlusion, significant stenosis, contrast extravasation or aneurysm. VENOUS SINUSES: Major dural venous sinuses are patent though not tailored for evaluation on this angiographic examination. ANATOMIC VARIANTS: None. DELAYED PHASE: No abnormal intracranial enhancement. MIP images reviewed. IMPRESSION: CT HEAD: 1. No acute intracranial process. 2. Mild parenchymal brain volume loss and mild chronic small vessel ischemic changes. 3. Chronic paranasal sinusitis. CTA NECK: 1. No hemodynamically significant stenosis ICA's. Patent vertebral arteries. 2. Moderate to severe C6-7 neural foraminal narrowing. CTA HEAD: 1. No emergent large vessel occlusion or flow-limiting stenosis. Electronically Signed   By: Elon Alas M.D.   On: 03/16/2018 01:31   Ct Angio Neck W And/or Wo Contrast  Result Date: 03/16/2018 CLINICAL DATA:  Dizziness and nausea beginning at 2 today. EXAM: CT ANGIOGRAPHY HEAD AND NECK TECHNIQUE: Multidetector CT imaging of the head and neck was performed using the standard protocol during bolus administration of intravenous contrast. Multiplanar CT image reconstructions and MIPs were obtained to evaluate the vascular anatomy. Carotid stenosis measurements (when applicable) are obtained utilizing NASCET criteria, using the distal internal carotid diameter as the denominator.  CONTRAST:  76mL ISOVUE-370 IOPAMIDOL (ISOVUE-370) INJECTION 76% COMPARISON:  None. FINDINGS: CT HEAD FINDINGS BRAIN: No intraparenchymal hemorrhage, mass effect nor midline shift. No hydrocephalus. Patchy supratentorial white matter hypodensities, mild ex vacuo dilatation RIGHT lateral ventricle. Mild parenchymal brain volume loss. No acute large vascular territory infarcts. No abnormal extra-axial fluid collections. Basal cisterns are patent. VASCULAR: Trace calcific atherosclerosis of the carotid siphons. SKULL: No skull fracture. No significant scalp soft tissue swelling. 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 Probable antrectomy and sphenoid sinus surgery with mucoperiosteal reaction. Mastoid air cells are well aerated.The included ocular globes and orbital contents are non-suspicious. OTHER: None. CTA NECK FINDINGS: AORTIC ARCH: Normal appearance of the thoracic arch, mild intimal thickening. Normal branch pattern. The origins of the innominate, left Common carotid artery and subclavian artery are widely patent. RIGHT CAROTID SYSTEM: Common carotid artery is patent. Mild intimal thickening of the carotid bifurcation without hemodynamically significant stenosis by NASCET criteria. Normal appearance of the internal carotid artery. LEFT CAROTID SYSTEM: Common carotid artery is patent. Normal appearance of the carotid bifurcation without hemodynamically significant stenosis by NASCET criteria. Normal appearance of the internal carotid artery. VERTEBRAL ARTERIES:Codominant vertebral arteries. Normal appearance of the vertebral arteries, widely patent. SKELETON: No acute osseous process though bone windows have not been submitted. Degenerative change of the cervical spine. Moderate C3-4 and C5-6 neural foraminal narrowing, moderate to severe C6-7 neural foraminal narrowing. OTHER NECK: Soft tissues of the neck are nonacute though, not tailored for evaluation. UPPER CHEST: Included lung  apices are clear. No superior mediastinal lymphadenopathy. CTA HEAD FINDINGS: ANTERIOR  CIRCULATION: Patent cervical internal carotid arteries, petrous, cavernous and supra clinoid internal carotid arteries. Patent anterior communicating artery. Patent anterior and middle cerebral arteries. Minimal stenosis LEFT M1 inferior division compatible with atherosclerosis. No large vessel occlusion, significant stenosis, contrast extravasation or aneurysm. POSTERIOR CIRCULATION: Patent vertebral arteries, vertebrobasilar junction and basilar artery, as well as main branch vessels. Patent posterior cerebral arteries. No large vessel occlusion, significant stenosis, contrast extravasation or aneurysm. VENOUS SINUSES: Major dural venous sinuses are patent though not tailored for evaluation on this angiographic examination. ANATOMIC VARIANTS: None. DELAYED PHASE: No abnormal intracranial enhancement. MIP images reviewed. IMPRESSION: CT HEAD: 1. No acute intracranial process. 2. Mild parenchymal brain volume loss and mild chronic small vessel ischemic changes. 3. Chronic paranasal sinusitis. CTA NECK: 1. No hemodynamically significant stenosis ICA's. Patent vertebral arteries. 2. Moderate to severe C6-7 neural foraminal narrowing. CTA HEAD: 1. No emergent large vessel occlusion or flow-limiting stenosis. Electronically Signed   By: Elon Alas M.D.   On: 03/16/2018 01:31   Mr Brain Wo Contrast  Result Date: 03/16/2018 CLINICAL DATA:  Dizziness and vomiting since 4 p.m. Symptoms progressively worsening, now severe. Suspect stroke. EXAM: MRI HEAD WITHOUT CONTRAST TECHNIQUE: Multiplanar, multiecho pulse sequences of the brain and surrounding structures were obtained without intravenous contrast. COMPARISON:  CT HEAD April 12, 2018 FINDINGS: INTRACRANIAL CONTENTS: No reduced diffusion to suggest acute ischemia or hyperacute demyelination. No susceptibility artifact to suggest hemorrhage. Borderline parenchymal brain  volume loss for age. 12 mm RIGHT periventricular ovoid FLAIR T2 hyperintensity with low T1 signal suggestive black holes of demyelination. 3 mm T2 hyperintensity RIGHT cerebellum equivocal for demyelination. A few additional subcentimeter scattered predominantly subcortical white matter FLAIR T2 hyperintensities. No suspicious parenchymal signal, masses, mass effect. No abnormal extra-axial fluid collections. No extra-axial masses. VASCULAR: Normal major intracranial vascular flow voids present at skull base. SKULL AND UPPER CERVICAL SPINE: No abnormal sellar expansion. No suspicious calvarial bone marrow signal. Craniocervical junction maintained. SINUSES/ORBITS: Severe chronic RIGHT maxillary sinusitis with atresia. Included ocular globes and orbital contents are non-suspicious. OTHER: None. IMPRESSION: 1. No acute intracranial process. 2. 12 mm RIGHT frontal white matter lesion with imaging characteristics of chronic demyelination. 3. Additional white matter changes more characteristics of mild chronic small vessel ischemic, less likely demyelination. 4. Borderline parenchymal brain volume loss for age. Electronically Signed   By: Elon Alas M.D.   On: 03/16/2018 03:18    EKG: Independently reviewed. Sinus rhythm.  Assessment/Plan   1. Severe vertigo  - Presents with waxing and waning vertigo, becoming more severe and persistent overnight  - CTA head and neck performed with no hemodynamically significant stenoses or emergent large vessel occlusion  - MRI brain with no acute intracranial findings  - He continues to have disabling symptoms in ED despite treatment with antiemetics, antihistamine, and benzodiazepine  - Continue supportive care, consult OT   2. Transient bradycardia  - Transient bradycardia noted in ED, appears to be sinus  - Occurred in setting of nausea/vomiting episodes and likely vasovagal response   - Continue cardiac monitoring    DVT prophylaxis: Lovenox Code Status:  Full  Family Communication: Discussed with patient  Consults called: Neurology  Admission status: Observation     Vianne Bulls, MD [facility] Pager [phone]  If 7PM-7AM, please contact night-coverage [email or web address] Password [facility]  03/16/2018, 5:38 AM

## 2018-03-16 NOTE — Care Management Note (Signed)
Case Management Note  Patient Details  Name: Sean Alvarez MRN: [id number] Date of Birth: 1960-12-21  Subjective/Objective: Severe Vertigo                 Action/Plan: CM talked to patient about Outpatient PT / Vestibular Training; pt chose [facility]; referral placed as ordered. They will contact patient for a start up date and time for his therapy.  Expected Discharge Date:  03/16/18               Expected Discharge Plan:   Home  Status of Service:   In progress  Sherrilyn Rist [phone] 03/16/2018, 4:29 PM

## 2018-03-16 NOTE — Evaluation (Addendum)
Physical Therapy Evaluation/Vestibular Assessment Patient Details Name: Sean Alvarez MRN: [id number] DOB: 18-Aug-1960 Today's Date: 03/16/2018   History of Present Illness  57 y.o. male admitted on 03/15/18 for dizziness, N/V.  Pt's MRI was negative for stroke.  Pt did have some episodes of brady cardia with pauses while vomiting.  Pt with other significant PMH of recent antibiotic use for staph skin infection.  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 He is showing improvement already with gait and balance, as well as symptoms on the prescribed meds (nausea, dizziness, and steroid).  PT educated him on compensatory techniques and habituation exercises to hopefully help increase his safety and speed up his symptom recovery.  I would like him to be seen at [facility] after discharge as they specialize in the evaluation and treatment of vestibular disorders.   PT to follow acutely for deficits listed below.       Follow Up Recommendations Outpatient PT;Other (comment)(at [facility] for vestibular therapy)    Equipment Recommendations  None recommended by PT    Recommendations for Other Services   NA    Precautions / Restrictions Precautions Precautions: Fall Precaution Comments: staggers/lists to the right and turning right elicits more staggering than left.       Mobility  Bed Mobility Overal bed mobility: Independent                Transfers Overall transfer level: Needs assistance   Transfers: Sit to/from Stand Sit to Stand: Min guard         General transfer comment: Min guard assist for safety during transitions, (+) static sway in standing.   Ambulation/Gait Ambulation/Gait assistance: Min guard Gait Distance (Feet): 300 Feet Assistive device: None Gait Pattern/deviations: Step-through pattern;Staggering right;Drifts right/left Gait  velocity: decreased Gait velocity interpretation: 1.31 - 2.62 ft/sec, indicative of limited community ambulator General Gait Details: Pt tends to drift right during gait, staggers more when turning right.  Min guard assist for safety and balance, used targeting and segmental turning during gait which seemed to help.          Balance Overall balance assessment: Needs assistance Sitting-balance support: Feet supported;No upper extremity supported Sitting balance-Leahy Scale: Good   Postural control: Posterior lean Standing balance support: No upper extremity supported Standing balance-Leahy Scale: Fair Standing balance comment: close supervision in standing.          03/16/18 1213  Vestibular Assessment  General Observation some tinnitus originally, none now, no recent URI, he did just have his flu shot as these symptoms started, he has been on antibiotic recently for staph skin infection (doxycycline), no recent head trauma, wears trifocals (did not have them with him), no sinus drainage, no visual changes, no HA, (+) neck pain, but he has been vomiting frequently.   Symptom Behavior  Type of Dizziness Comment (spinning and imbalance)  Frequency of Dizziness initially intermittent, but then became continuous  Duration of Dizziness initially seconds, then became constant, hours  Aggravating Factors Activity in general  Relieving Factors Lying supine;Closing eyes  Occulomotor Exam  Occulomotor Alignment Abnormal (left eye seems a bit adducted to me)  Spontaneous Left beating nystagmus  Gaze-induced Left beating nystagmus with L gaze (follows Alexander's Law)  Smooth Pursuits Saccades (not sure if true saccades vs nystagmic intrusions)  Saccades Intact  Vestibulo-Occular Reflex  VOR 1 Head Only (x 1 viewing) did not increase symptoms, but pt  had a hard time keeping track of the target both vertically and horizontally.   VOR Cancellation Normal  Auditory  Comments grossly equal  hearing bil  Positional Testing  Dix-Hallpike  (testing not indicated)                          Pertinent Vitals/Pain Pain Assessment: Faces Faces Pain Scale: Hurts little more Pain Location: neck soreness Pain Descriptors / Indicators: Aching Pain Intervention(s): Limited activity within patient's tolerance;Monitored during session;Repositioned    Home Living Family/patient expects to be discharged to:: Private residence Living Arrangements: Spouse/significant other Available Help at Discharge: Family;Available 24 hours/day Type of Home: House       Home Layout: Multi-level Home Equipment: None      Prior Function Level of Independence: Independent         Comments: works full time at a desk job, drives, normally completely independent.      Hand Dominance   Dominant Hand: Right    Extremity/Trunk Assessment   Upper Extremity Assessment Upper Extremity Assessment: Overall WFL for tasks assessed    Lower Extremity Assessment Lower Extremity Assessment: Overall WFL for tasks assessed    Cervical / Trunk Assessment Cervical / Trunk Assessment: Normal  Communication   Communication: No difficulties  Cognition Arousal/Alertness: Awake/alert Behavior During Therapy: WFL for tasks assessed/performed Overall Cognitive Status: Within Functional Limits for tasks assessed                                               Assessment/Plan    PT Assessment Patient needs continued PT services  PT Problem List Decreased activity tolerance;Decreased balance;Decreased mobility;Decreased coordination       PT Treatment Interventions DME instruction;Gait training;Stair training;Functional mobility training;Therapeutic activities;Therapeutic exercise;Balance training;Neuromuscular re-education;Patient/family education    PT Goals (Current goals can be found in the Care Plan section)  Acute Rehab PT Goals Patient Stated Goal: to return to  normal PT Goal Formulation: With patient Time For Goal Achievement: 03/30/18 Potential to Achieve Goals: Good    Frequency Min 4X/week           AM-PAC PT "6 Clicks" Daily Activity  Outcome Measure Difficulty turning over in bed (including adjusting bedclothes, sheets and blankets)?: A Little Difficulty moving from lying on back to sitting on the side of the bed? : A Little Difficulty sitting down on and standing up from a chair with arms (e.g., wheelchair, bedside commode, etc,.)?: Unable Help needed moving to and from a bed to chair (including a wheelchair)?: A Little Help needed walking in hospital room?: A Little Help needed climbing 3-5 steps with a railing? : A Little 6 Click Score: 16    End of Session Equipment Utilized During Treatment: Gait belt Activity Tolerance: Patient tolerated treatment well Patient left: in bed;with call bell/phone within reach;with family/visitor present Nurse Communication: Mobility status PT Visit Diagnosis: Unsteadiness on feet (R26.81);Dizziness and giddiness (R42);Difficulty in walking, not elsewhere classified (R26.2)    Time: 3419-3790 PT Time Calculation (min) (ACUTE ONLY): 55 min   Charges:             Wells Guiles B. Dazani Norby, PT, DPT  Acute Rehabilitation [phone] pager #[phone] office   PT Evaluation $PT Eval Low Complexity: 1 Low PT Treatments $Gait Training: 8-22 mins $Therapeutic Exercise: 8-22 mins $Therapeutic Activity: 8-22 mins  03/16/2018, 12:11 PM

## 2018-03-16 NOTE — Consult Note (Signed)
Referring Physician: Dr. Kathrynn Humble    Chief Complaint: Vertigo with gait instability  HPI: Sean Alvarez is an 57 y.o. male presenting with acute onset of vertigo beginning today at 4 PM, initially following a stuttering course with complete resolution between episodes, but then with persistence of symptoms beginning at 10 PM. After 10 PM, the vertigo was accompanied by gait instability with listing to his left.   The patient denies ear fullness or ear pain, but has had some tinnitus for some time. He has no history of stroke or MI. He does not take ASA, Plavix or a blood thinner at home.   He has had some posterior neck pain for about 2 days. He denies recent trauma. No headache. No visual field cut but does endorse oscillopsia. No limb weakness, limb numbness or paresthesia. No CP.   Had N/V in the ED. The spells of nausea are preceded by bradycardia.   LSN: 10 PM tPA Given: No: NIHSS of 2 with relatively mild ataxia. Other etiology for vertigo than stroke, including peripheral vestibulopathy is relatively likely. Risks of tPA are overall felt to outweigh benefits.   Past Medical History:  Diagnosis Date  . Asthma     Past Surgical History:  Procedure Laterality Date  . HERNIA REPAIR    . TONSILLECTOMY      No family history on file. Social History:  reports that he has never smoked. He has never used smokeless tobacco. He reports that he drinks about 4.0 - 6.0 standard drinks of alcohol per week. He reports that he does not use drugs.  Allergies:  Allergies  Allergen Reactions  . Compazine [Prochlorperazine Edisylate] Nausea And Vomiting    Convulsions    Medications: None listed in Epic. Patient states he is not on anticoagulation or an antiplatelet agent.   ROS: As per HPI.   Physical Examination: Blood pressure (!) 157/101, pulse (!) 54, temperature 98.4 F (36.9 C), temperature source Oral, resp. rate 10, height '5\' 10"'$  (1.778 m), weight 93 kg, SpO2 96 %.  HEENT:  Alopecia areata noted. [place]/AT Lungs: Respirations unlabored Ext: No edema.  Neurologic Examination: Mental Status: Alert, oriented, thought content appropriate.  Speech fluent without evidence of aphasia.  Able to follow all commands without difficulty. Cranial Nerves: II:  Visual fields intact bilaterally. Right pupil 3 mm and reactive, left pupil 4 mm and reactive.  III,IV, VI: Left palpebral fissure smaller than on right. EOM are conjugate and full. There is prominent fast-beating nystagmus to the left with central gaze, left lateral gaze, upgaze and to a lesser extent downgaze. The nystagmus resolves with rightward gaze. Head impulse test shows correcting saccade when head rapidly rotated to midline from left lateral rotated position. Test for skew is negative.  V,VII: Smile symmetric, facial temp sensation intact bilaterally VIII: hearing intact to voice IX,X: Palate rises symmetrically XI: Symmetric XII: midline tongue extension  Motor: RUE: 5/5 proximal and distal RLE: 5/5 proximal and distal LUE: 5/5 proximal and distal LLE: 5/5 proximal and distal Tone normal No pronator drift Sensory: Temp and light touch intact throughout, bilaterally. No extinction.  Deep Tendon Reflexes:  2+ bilateral upper extremities.  2+ right patella, 4+ left patella (crossed adductor response) 2+ bilateral achilles Plantars: Right: downgoing   Left: downgoing Cerebellar: Mild ataxia with left H-S, normal on the right. Subtle dysdiadochokinesia with left UE RAM, normal on right.  Normal FNF bilaterally.  Gait: Deferred  Results for orders placed or performed during the hospital encounter of 03/15/18 (  from the past 48 hour(s))  Basic metabolic panel     Status: Abnormal   Collection Time: 03/16/18 12:27 AM  Result Value Ref Range   Sodium 140 135 - 145 mmol/L   Potassium 3.6 3.5 - 5.1 mmol/L   Chloride 107 98 - 111 mmol/L   CO2 25 22 - 32 mmol/L   Glucose, Bld 149 (H) 70 - 99 mg/dL   BUN 17 6 -  20 mg/dL   Creatinine, Ser 1.01 0.61 - 1.24 mg/dL   Calcium 8.7 (L) 8.9 - 10.3 mg/dL   GFR calc non Af Amer >60 >60 mL/min   GFR calc Af Amer >60 >60 mL/min    Comment: (NOTE) The eGFR has been calculated using the CKD EPI equation. This calculation has not been validated in all clinical situations. eGFR's persistently <60 mL/min signify possible Chronic Kidney Disease.    Anion gap 8 5 - 15    Comment: Performed at [facility] [address] [place] [address]  CBC     Status: None   Collection Time: 03/16/18 12:27 AM  Result Value Ref Range   WBC 8.3 4.0 - 10.5 K/uL   RBC 5.12 4.22 - 5.81 MIL/uL   Hemoglobin 14.6 13.0 - 17.0 g/dL   HCT 44.4 39.0 - 52.0 %   MCV 86.7 78.0 - 100.0 fL   MCH 28.5 26.0 - 34.0 pg   MCHC 32.9 30.0 - 36.0 g/dL   RDW 12.7 11.5 - 15.5 %   Platelets 259 150 - 400 K/uL    Comment: Performed at [facility], [place] [address], [place], [place] [address]  Magnesium     Status: None   Collection Time: 03/16/18 12:27 AM  Result Value Ref Range   Magnesium 2.1 1.7 - 2.4 mg/dL    Comment: Performed at [facility], [place] [address], [place], [place] [address]  Phosphorus     Status: None   Collection Time: 03/16/18 12:27 AM  Result Value Ref Range   Phosphorus 2.7 2.5 - 4.6 mg/dL    Comment: Performed at [facility] [address], [place], [place] [address]  Protime-INR     Status: None   Collection Time: 03/16/18 12:27 AM  Result Value Ref Range   Prothrombin Time 12.9 11.4 - 15.2 seconds   INR 0.98     Comment: Performed at [facility] [address], [place], [place] [address]  APTT     Status: Abnormal   Collection Time: 03/16/18 12:27 AM  Result Value Ref Range   aPTT 23 (L) 24 - 36 seconds    Comment: Performed at [facility], [place] [address] [place], [place] [address]  Differential     Status: None   Collection Time: 03/16/18 12:27 AM  Result Value Ref Range   Neutrophils Relative % 60 %    Neutro Abs 5.0 1.7 - 7.7 K/uL   Lymphocytes Relative 29 %   Lymphs Abs 2.4 0.7 - 4.0 K/uL   Monocytes Relative 8 %   Monocytes Absolute 0.7 0.1 - 1.0 K/uL   Eosinophils Relative 2 %   Eosinophils Absolute 0.1 0.0 - 0.7 K/uL   Basophils Relative 1 %   Basophils Absolute 0.1 0.0 - 0.1 K/uL   Immature Granulocytes 0 %   Abs Immature Granulocytes 0.0 0.0 - 0.1 K/uL    Comment: Performed at [facility] [address] [place], [place]  [address]  I-Stat Troponin, ED (not at [facility])     Status: None   Collection Time: 03/16/18 12:34 AM  Result Value Ref Range   Troponin i, poc 0.01 0.00 - 0.08 ng/mL   Comment 3            Comment: Due to the release kinetics of cTnI, a negative result within the first hours of the onset of symptoms does not rule out myocardial infarction with certainty. If myocardial infarction is still suspected, repeat the test at appropriate intervals.   CBG monitoring, ED     Status: Abnormal   Collection Time: 03/16/18 12:41 AM  Result Value Ref Range   Glucose-Capillary 141 (H) 70 - 99 mg/dL  Ethanol     Status: None   Collection Time: 03/16/18 12:50 AM  Result Value Ref Range   Alcohol, Ethyl (B) <10 <10 mg/dL    Comment: (NOTE) Lowest detectable limit for serum alcohol is 10 mg/dL. For medical purposes only. Performed at [facility], [place] [address], [place], [place] [address]    Ct Angio Head W Or Wo Contrast  Result Date: 03/16/2018 CLINICAL DATA:  Dizziness and nausea beginning at 2 today. EXAM: CT ANGIOGRAPHY HEAD AND NECK TECHNIQUE: Multidetector CT imaging of the head and neck was performed using the standard protocol during bolus administration of intravenous contrast. Multiplanar CT image reconstructions and MIPs were obtained to evaluate the vascular anatomy. Carotid stenosis measurements (when applicable) are obtained utilizing NASCET criteria, using the distal internal carotid diameter as the denominator. CONTRAST:  95m ISOVUE-370  IOPAMIDOL (ISOVUE-370) INJECTION 76% COMPARISON:  None. FINDINGS: CT HEAD FINDINGS BRAIN: No intraparenchymal hemorrhage, mass effect nor midline shift. No hydrocephalus. Patchy supratentorial white matter hypodensities, mild ex vacuo dilatation RIGHT lateral ventricle. Mild parenchymal brain volume loss. No acute large vascular territory infarcts. No abnormal extra-axial fluid collections. Basal cisterns are patent. VASCULAR: Trace calcific atherosclerosis of the carotid siphons. SKULL: No skull fracture. No significant scalp soft tissue swelling. 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 Probable antrectomy and sphenoid sinus surgery with mucoperiosteal reaction. Mastoid air cells are well aerated.The included ocular globes and orbital contents are non-suspicious. OTHER: None. CTA NECK FINDINGS: AORTIC ARCH: Normal appearance of the thoracic arch, mild intimal thickening. Normal branch pattern. The origins of the innominate, left Common carotid artery and subclavian artery are widely patent. RIGHT CAROTID SYSTEM: Common carotid artery is patent. Mild intimal thickening of the carotid bifurcation without hemodynamically significant stenosis by NASCET criteria. Normal appearance of the internal carotid artery. LEFT CAROTID SYSTEM: Common carotid artery is patent. Normal appearance of the carotid bifurcation without hemodynamically significant stenosis by NASCET criteria. Normal appearance of the internal carotid artery. VERTEBRAL ARTERIES:Codominant vertebral arteries. Normal appearance of the vertebral arteries, widely patent. SKELETON: No acute osseous process though bone windows have not been submitted. Degenerative change of the cervical spine. Moderate C3-4 and C5-6 neural foraminal narrowing, moderate to severe C6-7 neural foraminal narrowing. OTHER NECK: Soft tissues of the neck are nonacute though, not tailored for evaluation. UPPER CHEST: Included lung apices are clear. No  superior mediastinal lymphadenopathy. CTA HEAD FINDINGS: ANTERIOR CIRCULATION: Patent cervical internal carotid arteries, petrous, cavernous and supra clinoid internal carotid arteries. Patent anterior communicating artery. Patent anterior and middle cerebral arteries. Minimal stenosis LEFT M1 inferior division compatible with atherosclerosis. No large vessel occlusion, significant stenosis, contrast extravasation or aneurysm. POSTERIOR CIRCULATION: Patent vertebral arteries, vertebrobasilar junction and basilar artery, as well as main branch vessels. Patent posterior cerebral  arteries. No large vessel occlusion, significant stenosis, contrast extravasation or aneurysm. VENOUS SINUSES: Major dural venous sinuses are patent though not tailored for evaluation on this angiographic examination. ANATOMIC VARIANTS: None. DELAYED PHASE: No abnormal intracranial enhancement. MIP images reviewed. IMPRESSION: CT HEAD: 1. No acute intracranial process. 2. Mild parenchymal brain volume loss and mild chronic small vessel ischemic changes. 3. Chronic paranasal sinusitis. CTA NECK: 1. No hemodynamically significant stenosis ICA's. Patent vertebral arteries. 2. Moderate to severe C6-7 neural foraminal narrowing. CTA HEAD: 1. No emergent large vessel occlusion or flow-limiting stenosis. Electronically Signed   By: Elon Alas M.D.   On: 03/16/2018 01:31   Ct Angio Neck W And/or Wo Contrast  Result Date: 03/16/2018 CLINICAL DATA:  Dizziness and nausea beginning at 2 today. EXAM: CT ANGIOGRAPHY HEAD AND NECK TECHNIQUE: Multidetector CT imaging of the head and neck was performed using the standard protocol during bolus administration of intravenous contrast. Multiplanar CT image reconstructions and MIPs were obtained to evaluate the vascular anatomy. Carotid stenosis measurements (when applicable) are obtained utilizing NASCET criteria, using the distal internal carotid diameter as the denominator. CONTRAST:  17m ISOVUE-370  IOPAMIDOL (ISOVUE-370) INJECTION 76% COMPARISON:  None. FINDINGS: CT HEAD FINDINGS BRAIN: No intraparenchymal hemorrhage, mass effect nor midline shift. No hydrocephalus. Patchy supratentorial white matter hypodensities, mild ex vacuo dilatation RIGHT lateral ventricle. Mild parenchymal brain volume loss. No acute large vascular territory infarcts. No abnormal extra-axial fluid collections. Basal cisterns are patent. VASCULAR: Trace calcific atherosclerosis of the carotid siphons. SKULL: No skull fracture. No significant scalp soft tissue swelling. 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 Probable antrectomy and sphenoid sinus surgery with mucoperiosteal reaction. Mastoid air cells are well aerated.The included ocular globes and orbital contents are non-suspicious. OTHER: None. CTA NECK FINDINGS: AORTIC ARCH: Normal appearance of the thoracic arch, mild intimal thickening. Normal branch pattern. The origins of the innominate, left Common carotid artery and subclavian artery are widely patent. RIGHT CAROTID SYSTEM: Common carotid artery is patent. Mild intimal thickening of the carotid bifurcation without hemodynamically significant stenosis by NASCET criteria. Normal appearance of the internal carotid artery. LEFT CAROTID SYSTEM: Common carotid artery is patent. Normal appearance of the carotid bifurcation without hemodynamically significant stenosis by NASCET criteria. Normal appearance of the internal carotid artery. VERTEBRAL ARTERIES:Codominant vertebral arteries. Normal appearance of the vertebral arteries, widely patent. SKELETON: No acute osseous process though bone windows have not been submitted. Degenerative change of the cervical spine. Moderate C3-4 and C5-6 neural foraminal narrowing, moderate to severe C6-7 neural foraminal narrowing. OTHER NECK: Soft tissues of the neck are nonacute though, not tailored for evaluation. UPPER CHEST: Included lung apices are clear. No  superior mediastinal lymphadenopathy. CTA HEAD FINDINGS: ANTERIOR CIRCULATION: Patent cervical internal carotid arteries, petrous, cavernous and supra clinoid internal carotid arteries. Patent anterior communicating artery. Patent anterior and middle cerebral arteries. Minimal stenosis LEFT M1 inferior division compatible with atherosclerosis. No large vessel occlusion, significant stenosis, contrast extravasation or aneurysm. POSTERIOR CIRCULATION: Patent vertebral arteries, vertebrobasilar junction and basilar artery, as well as main branch vessels. Patent posterior cerebral arteries. No large vessel occlusion, significant stenosis, contrast extravasation or aneurysm. VENOUS SINUSES: Major dural venous sinuses are patent though not tailored for evaluation on this angiographic examination. ANATOMIC VARIANTS: None. DELAYED PHASE: No abnormal intracranial enhancement. MIP images reviewed. IMPRESSION: CT HEAD: 1. No acute intracranial process. 2. Mild parenchymal brain volume loss and mild chronic small vessel ischemic changes. 3. Chronic paranasal sinusitis. CTA NECK: 1. No hemodynamically significant stenosis ICA's. Patent  vertebral arteries. 2. Moderate to severe C6-7 neural foraminal narrowing. CTA HEAD: 1. No emergent large vessel occlusion or flow-limiting stenosis. Electronically Signed   By: Elon Alas M.D.   On: 03/16/2018 01:31    Assessment: 57 y.o. male with acute onset of vertigo with unilateral nystagmus.  1. HINTS exam is reassuring with findings most consistent with a peripheral vestibulopathy. However, there is mild left sided ataxia. Localization could be central (cerebellar or small brainstem stroke) versus peripheral (Meniere's disease, BPPV, vestibular neuritis).  2. tPA risks/benefits discussed with patient. NIHSS of 2 with relatively mild ataxia in two limbs. Other etiology for vertigo than stroke, including peripheral vestibulopathy is relatively likely. Risks of tPA are overall  felt to outweigh benefits. Discussed at length with patient and significant other. Patient expressed desire not to proceed with tPA.  3. CTA negative for LVO.  4. Stroke Risk Factors - None 5. Intermittent bradycardia.   Plan: 1. STAT MRI brain.  2. Call Neurology with results to determine next steps in management/workup 3. Most likely will need to be admitted due to disabling vertigo, even if no stroke seen on MRI brain 4. Telemetry monitoring and management of bradycardia as per primary team 5. Management of N/V as per primary team 6. Frequent neuro checks   '@Electronically'$  signed: Dr. Kerney Elbe 03/16/2018, 2:13 AM

## 2018-03-16 NOTE — Progress Notes (Signed)
Left message with case management about PT recommendations for outpatient vestibular therapy. Awaiting return call.

## 2018-03-16 NOTE — Progress Notes (Signed)
Subjective: Dizziness has persisted since the time of admission. He does have complete resolution of the dizziness when he closes his eyes and some relief of the symptoms when he keeps his head still. He has not attempted to walk since the time of admission. He denies any new symptoms such as numbness or weakness.   Exam: Vitals:   03/16/18 0600 03/16/18 0650  BP: (!) 143/90 130/83  Pulse: 62 (!) 58  Resp: 17 20  Temp:  97.8 F (36.6 C)  SpO2: 92% 97%   Gen: Sitting up in bed, NAD Resp: non-labored breathing, no acute distress Abd: soft, nt  Neuro: MS: Awake alert oriented, thought content appropriate. Following commands  CN: pupils 46mm equally round and reactive, visual fields intact, EOM intact, left beating nystagmus with forward and leftward gaze which resolves with rightward gaze. Smile is symmetric. Tongue protrudes midline. Hearing is intact to voice. Negative test of skew. Head impulse negative.  Cerebellar: normal finger to nose and heel to shin testing bilateral  Marye Round did not produce a opsital nystagmus  Motor: Normal tone, 5/5 in proximal and distal bilateral upper and lower extremities  Sensory: intact and equal to light touch over the face, upper and lower extremities  DTR: 2+ bilateral biceps, patella  Pertinent Labs: BMP Latest Ref Rng & Units 03/16/2018  Glucose 70 - 99 mg/dL 149(H)  BUN 6 - 20 mg/dL 17  Creatinine 0.61 - 1.24 mg/dL 1.01  Sodium 135 - 145 mmol/L 140  Potassium 3.5 - 5.1 mmol/L 3.6  Chloride 98 - 111 mmol/L 107  CO2 22 - 32 mmol/L 25  Calcium 8.9 - 10.3 mg/dL 8.7(L)    Impression: 56 year old man without significant PMH presenting with acute onset of vertigo found to have unilateral nystagmus and ataxia proceeded by a day of tinitis. The features of this presentation are reassuring for peripheral etiology of the vertigo probably secondary to vestibular neuritis.   - CTA head and neck neg for LVO  - MRI brain with right frontal white  matter lesion with characteristic of chronic demyelination and white matter changes of mild chronic small vessel ischemia - Telemetry with evidence of AV block at 1:50 Am  Recommendations: 1) Physical Therapy  2) Start prednisone 60 mg daily for 5 days with plans for taper to 40 mg on day 6, 30 mg on day 7, 20 mg on day 8, 10 mg on day 9, 5 mg on day 10   Ledell Noss, MD PGY [facility]   @MPKSIGN @

## 2018-03-17 ENCOUNTER — Encounter (HOSPITAL_BASED_OUTPATIENT_CLINIC_OR_DEPARTMENT_OTHER): Payer: Self-pay | Admitting: Urology

## 2018-04-30 DIAGNOSIS — Z0000 Encounter for general adult medical examination without abnormal findings: Secondary | ICD-10-CM | POA: Diagnosis not present

## 2018-04-30 DIAGNOSIS — E039 Hypothyroidism, unspecified: Secondary | ICD-10-CM | POA: Diagnosis not present

## 2018-04-30 DIAGNOSIS — E663 Overweight: Secondary | ICD-10-CM | POA: Diagnosis not present

## 2018-04-30 DIAGNOSIS — Z125 Encounter for screening for malignant neoplasm of prostate: Secondary | ICD-10-CM | POA: Diagnosis not present

## 2018-05-18 DIAGNOSIS — B002 Herpesviral gingivostomatitis and pharyngotonsillitis: Secondary | ICD-10-CM | POA: Diagnosis not present

## 2018-05-18 DIAGNOSIS — E039 Hypothyroidism, unspecified: Secondary | ICD-10-CM | POA: Diagnosis not present

## 2018-05-18 DIAGNOSIS — Z0000 Encounter for general adult medical examination without abnormal findings: Secondary | ICD-10-CM | POA: Diagnosis not present

## 2019-04-11 DIAGNOSIS — R1032 Left lower quadrant pain: Secondary | ICD-10-CM | POA: Diagnosis not present

## 2019-04-12 DIAGNOSIS — R109 Unspecified abdominal pain: Secondary | ICD-10-CM | POA: Diagnosis not present

## 2019-04-15 DIAGNOSIS — K760 Fatty (change of) liver, not elsewhere classified: Secondary | ICD-10-CM | POA: Diagnosis not present

## 2019-04-15 DIAGNOSIS — R109 Unspecified abdominal pain: Secondary | ICD-10-CM | POA: Diagnosis not present

## 2019-04-21 DIAGNOSIS — R1032 Left lower quadrant pain: Secondary | ICD-10-CM | POA: Diagnosis not present

## 2019-04-21 DIAGNOSIS — K760 Fatty (change of) liver, not elsewhere classified: Secondary | ICD-10-CM | POA: Diagnosis not present

## 2019-06-03 DIAGNOSIS — Z1159 Encounter for screening for other viral diseases: Secondary | ICD-10-CM | POA: Diagnosis not present

## 2019-06-08 DIAGNOSIS — K648 Other hemorrhoids: Secondary | ICD-10-CM | POA: Diagnosis not present

## 2019-06-08 DIAGNOSIS — K5730 Diverticulosis of large intestine without perforation or abscess without bleeding: Secondary | ICD-10-CM | POA: Diagnosis not present

## 2019-06-08 DIAGNOSIS — D124 Benign neoplasm of descending colon: Secondary | ICD-10-CM | POA: Diagnosis not present

## 2019-06-08 DIAGNOSIS — Z1211 Encounter for screening for malignant neoplasm of colon: Secondary | ICD-10-CM | POA: Diagnosis not present

## 2019-06-08 DIAGNOSIS — K635 Polyp of colon: Secondary | ICD-10-CM | POA: Diagnosis not present

## 2019-08-19 IMAGING — MR MR HEAD W/O CM
10 of 11 series · 42 of 48 positions shown · non-contrast
Comparison: CT HEAD April 12, 2018

CLINICAL DATA: Dizziness and vomiting since 4 p.m.. Symptoms
progressively worsening, now severe. Suspect stroke.

EXAM:
MRI HEAD WITHOUT CONTRAST
TECHNIQUE: Multiplanar, multiecho pulse sequences of the brain and surrounding
structures were obtained without intravenous contrast.

[Series 5: DWI · axial · 4.0mm · 0.88mm/px · z∈[-62,+80]mm · 7 of 75 slices shown (1 of 4)]
[im 1/75]
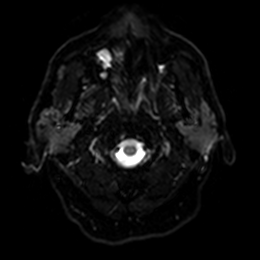
[im 13/75]
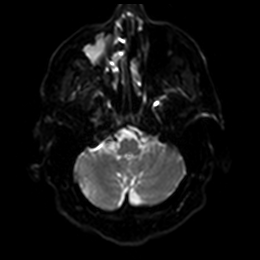
[im 25/75]
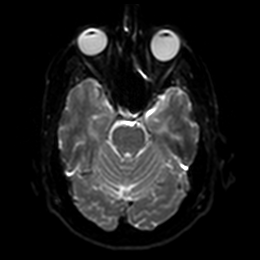
[im 38/75]
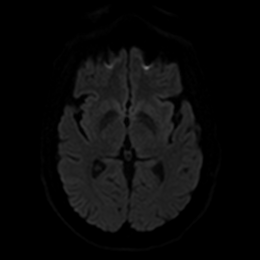
[im 50/75]
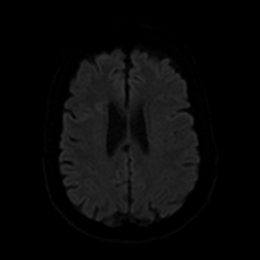
[im 62/75]
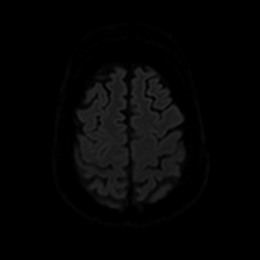
[im 75/75]
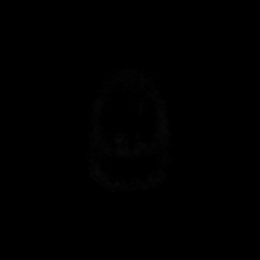

[Series 6: DWI · axial · 4.0mm · 0.88mm/px · z∈[-62,+80]mm · 4 of 38 slices shown (2 of 4)]
[im 1/38]
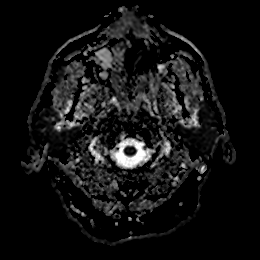
[im 13/38]
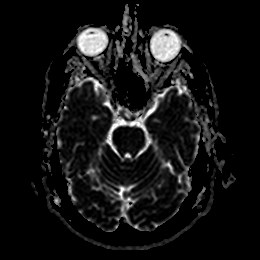
[im 25/38]
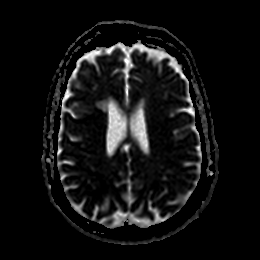
[im 38/38]
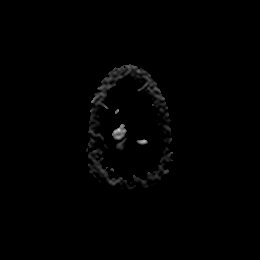

[Series 7: DWI · coronal · 4.0mm · 0.88mm/px · 7 of 70 slices shown (3 of 4)]
[im 1/70]
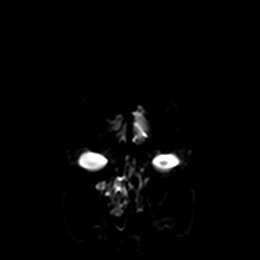
[im 12/70]
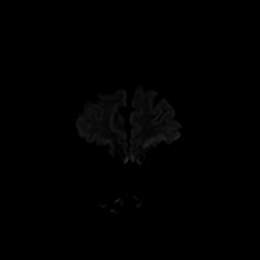
[im 24/70]
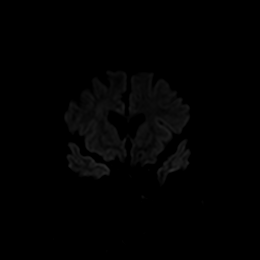
[im 35/70]
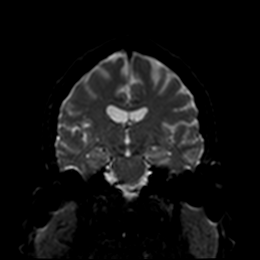
[im 47/70]
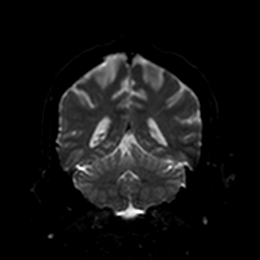
[im 58/70]
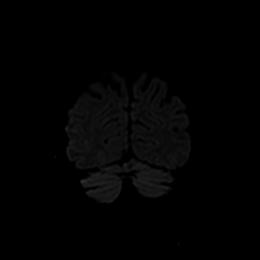
[im 70/70]
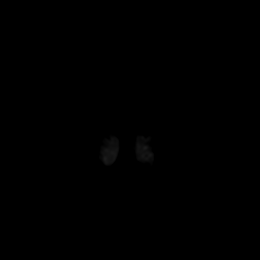

[Series 8: DWI · coronal · 4.0mm · 0.88mm/px · 3 of 35 slices shown (4 of 4)]
[im 1/35]
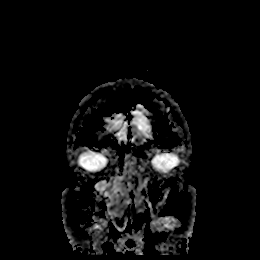
[im 18/35]
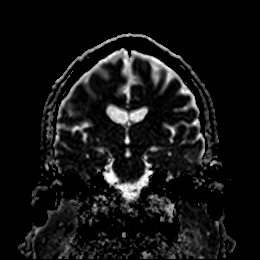
[im 35/35]
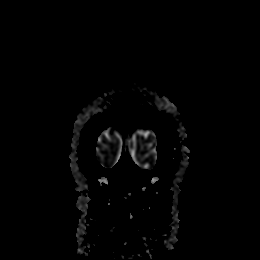

[Series 9: T1 · sagittal · 5.0mm · 0.75mm/px · 2 of 23 slices shown]
[im 1/23]
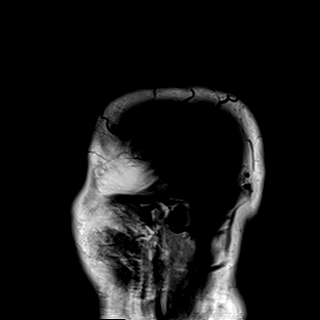
[im 23/23]
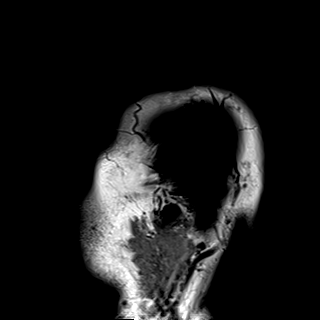

[Series 10: T2 · axial · 5.0mm · 0.72mm/px · z∈[-64,+80]mm · 3 of 26 slices shown (1 of 2)]
[im 1/26]
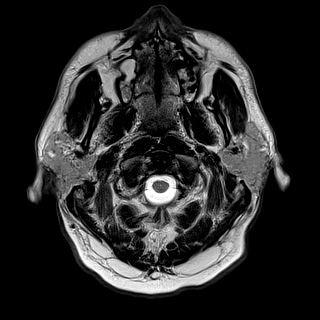
[im 13/26]
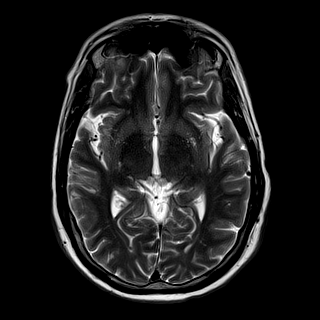
[im 26/26]
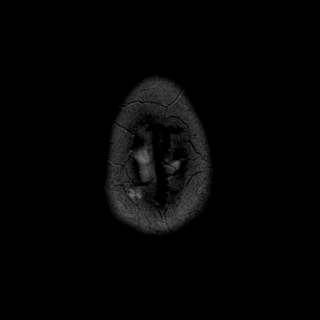

[Series 12: swi_images · axial · 3.0mm · 0.90mm/px · z∈[-70,+100]mm · 6 of 60 slices shown]
[im 1/60]
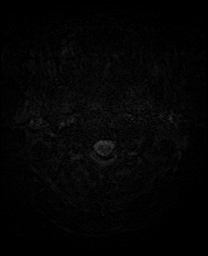
[im 12/60]
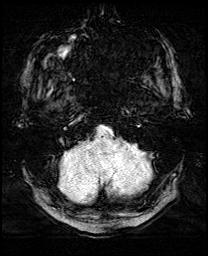
[im 24/60]
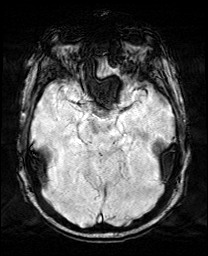
[im 36/60]
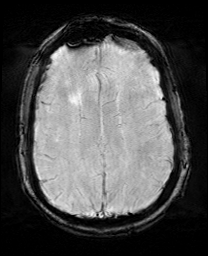
[im 48/60]
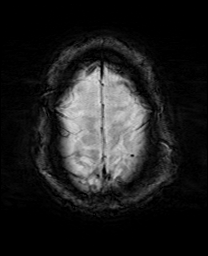
[im 60/60]
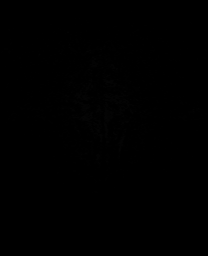

[Series 13: mip_images(sw) · axial · 24.0mm · 0.90mm/px · z∈[-60,+90]mm · 5 of 53 slices shown]
[im 1/53]
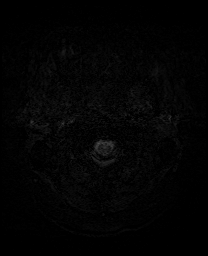
[im 14/53]
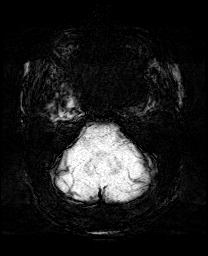
[im 27/53]
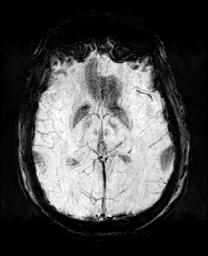
[im 40/53]
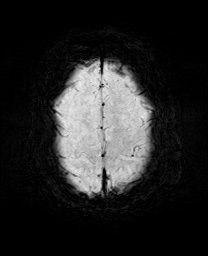
[im 53/53]
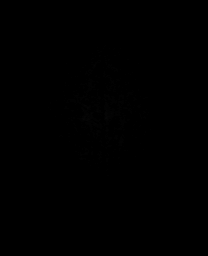

[Series 15: T2 · coronal · 5.0mm · 0.72mm/px · 3 of 28 slices shown (2 of 2)]
[im 1/28]
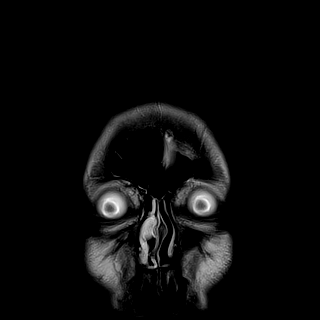
[im 14/28]
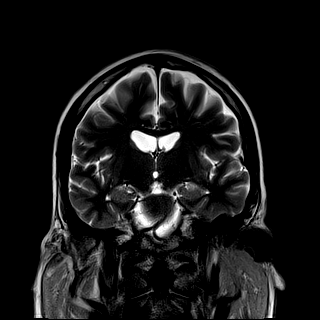
[im 28/28]
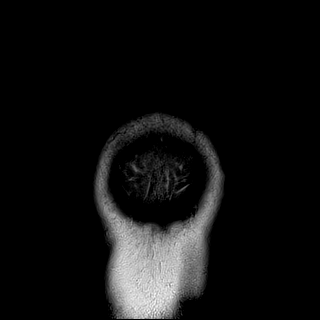

[Series 16: FLAIR · axial · 5.0mm · 0.90mm/px · z∈[-57,+82]mm · 2 of 25 slices shown]
[im 1/25]
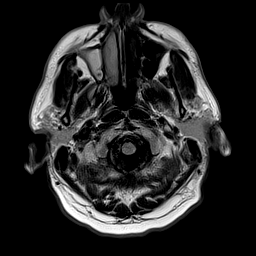
[im 25/25]
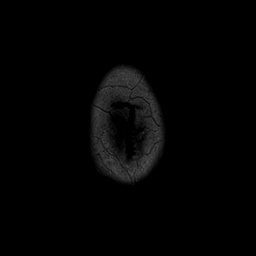

[42 of 48 positions shown; findings below may reference images not displayed]

FINDINGS: INTRACRANIAL CONTENTS: No reduced diffusion to suggest acute
ischemia or hyperacute demyelination. No susceptibility artifact to
suggest hemorrhage. Borderline parenchymal brain volume loss for
age. 12 mm RIGHT periventricular ovoid FLAIR T2 hyperintensity with
low T1 signal suggestive black holes of demyelination. 3 mm T2
hyperintensity RIGHT cerebellum equivocal for demyelination. A few
additional subcentimeter scattered predominantly subcortical white
matter FLAIR T2 hyperintensities. No suspicious parenchymal signal,
masses, mass effect. No abnormal extra-axial fluid collections. No
extra-axial masses.

VASCULAR: Normal major intracranial vascular flow voids present at
skull base.

SKULL AND UPPER CERVICAL SPINE: No abnormal sellar expansion. No
suspicious calvarial bone marrow signal. Craniocervical junction
maintained.

SINUSES/ORBITS: Severe chronic RIGHT maxillary sinusitis with
atresia. Included ocular globes and orbital contents are
non-suspicious.

OTHER: None.
IMPRESSION: 1. No acute intracranial process.
2. 12 mm RIGHT frontal white matter lesion with imaging
characteristics of chronic demyelination.
3. Additional white matter changes more characteristics of mild
chronic small vessel ischemic, less likely demyelination.
4. Borderline parenchymal brain volume loss for age.

## 2019-09-29 ENCOUNTER — Ambulatory Visit: Payer: BC Managed Care – PPO | Attending: Internal Medicine

## 2019-09-29 DIAGNOSIS — Z23 Encounter for immunization: Secondary | ICD-10-CM

## 2019-09-29 NOTE — Progress Notes (Signed)
   Covid-19 Vaccination Clinic  Name:  Sean Alvarez    MRN: [id number] DOB: [birth year]  09/29/2019  [name] was observed post Covid-19 immunization for 30 minutes based on pre-vaccination screening without incident. He was provided with Vaccine Information Sheet and instruction to access the V-Safe system.   [name] was instructed to call 911 with any severe reactions post vaccine: Marland Kitchen Difficulty breathing  . Swelling of face and throat  . A fast heartbeat  . A bad rash all over body  . Dizziness and weakness   Immunizations Administered    Name Date Dose VIS Date Route   Pfizer COVID-19 Vaccine 09/29/2019 12:33 PM 0.3 mL 06/24/2019 Intramuscular   Manufacturer: [place]   Lot: [id number]   [place]: [id number]

## 2019-10-20 DIAGNOSIS — Z0000 Encounter for general adult medical examination without abnormal findings: Secondary | ICD-10-CM | POA: Diagnosis not present

## 2019-10-20 DIAGNOSIS — Z125 Encounter for screening for malignant neoplasm of prostate: Secondary | ICD-10-CM | POA: Diagnosis not present

## 2019-10-20 DIAGNOSIS — E039 Hypothyroidism, unspecified: Secondary | ICD-10-CM | POA: Diagnosis not present

## 2019-10-20 DIAGNOSIS — R7301 Impaired fasting glucose: Secondary | ICD-10-CM | POA: Diagnosis not present

## 2019-10-20 DIAGNOSIS — Z0001 Encounter for general adult medical examination with abnormal findings: Secondary | ICD-10-CM | POA: Diagnosis not present

## 2019-10-24 ENCOUNTER — Ambulatory Visit: Payer: BC Managed Care – PPO | Attending: Internal Medicine

## 2019-10-24 DIAGNOSIS — Z23 Encounter for immunization: Secondary | ICD-10-CM

## 2019-10-24 NOTE — Progress Notes (Signed)
   Covid-19 Vaccination Clinic  Name:  Sean Alvarez    MRN: [id number] DOB: 1961/07/10  10/24/2019  Mr. Pace was observed post Covid-19 immunization for 15 minutes without incident. He was provided with Vaccine Information Sheet and instruction to access the V-Safe system.   [name] was instructed to call 911 with any severe reactions post vaccine: Marland Kitchen Difficulty breathing  . Swelling of face and throat  . A fast heartbeat  . A bad rash all over body  . Dizziness and weakness   Immunizations Administered    Name Date Dose VIS Date Route   Pfizer COVID-19 Vaccine 10/24/2019 11:07 AM 0.3 mL 06/24/2019 Intramuscular   Manufacturer: [place]   Lot: [id number]   [place]: [id number]

## 2019-10-27 DIAGNOSIS — M79674 Pain in right toe(s): Secondary | ICD-10-CM | POA: Diagnosis not present

## 2019-10-27 DIAGNOSIS — Z0000 Encounter for general adult medical examination without abnormal findings: Secondary | ICD-10-CM | POA: Diagnosis not present

## 2019-10-27 DIAGNOSIS — K760 Fatty (change of) liver, not elsewhere classified: Secondary | ICD-10-CM | POA: Diagnosis not present

## 2019-10-27 DIAGNOSIS — R42 Dizziness and giddiness: Secondary | ICD-10-CM | POA: Diagnosis not present

## 2019-10-27 DIAGNOSIS — S99921A Unspecified injury of right foot, initial encounter: Secondary | ICD-10-CM | POA: Diagnosis not present

## 2019-10-27 DIAGNOSIS — E7800 Pure hypercholesterolemia, unspecified: Secondary | ICD-10-CM | POA: Diagnosis not present

## 2020-01-26 DIAGNOSIS — D2261 Melanocytic nevi of right upper limb, including shoulder: Secondary | ICD-10-CM | POA: Diagnosis not present

## 2020-01-26 DIAGNOSIS — D485 Neoplasm of uncertain behavior of skin: Secondary | ICD-10-CM | POA: Diagnosis not present

## 2020-01-26 DIAGNOSIS — D3617 Benign neoplasm of peripheral nerves and autonomic nervous system of trunk, unspecified: Secondary | ICD-10-CM | POA: Diagnosis not present

## 2020-01-26 DIAGNOSIS — D225 Melanocytic nevi of trunk: Secondary | ICD-10-CM | POA: Diagnosis not present

## 2020-01-26 DIAGNOSIS — L814 Other melanin hyperpigmentation: Secondary | ICD-10-CM | POA: Diagnosis not present

## 2020-01-26 DIAGNOSIS — L570 Actinic keratosis: Secondary | ICD-10-CM | POA: Diagnosis not present

## 2020-01-26 DIAGNOSIS — D1801 Hemangioma of skin and subcutaneous tissue: Secondary | ICD-10-CM | POA: Diagnosis not present

## 2020-04-23 DIAGNOSIS — M436 Torticollis: Secondary | ICD-10-CM | POA: Diagnosis not present

## 2020-05-26 ENCOUNTER — Other Ambulatory Visit: Payer: Self-pay

## 2020-05-26 ENCOUNTER — Ambulatory Visit: Payer: BC Managed Care – PPO | Attending: Internal Medicine

## 2020-05-26 DIAGNOSIS — Z23 Encounter for immunization: Secondary | ICD-10-CM

## 2020-05-26 NOTE — Progress Notes (Signed)
   Covid-[facility] Vaccination [facility]  Name:  Sean Alvarez    MRN: [id number] DOB: 01-11-1961  05/26/2020  [name] was observed post Covid-19 immunization for 15 minutes without incident. He was provided with Vaccine Information Sheet and instruction to access the V-Safe system.   [name] was instructed to call 911 with any severe reactions post vaccine: Marland Kitchen Difficulty breathing  . Swelling of face and throat  . A fast heartbeat  . A bad rash all over body  . Dizziness and weakness   Immunizations Administered    Name Date Dose VIS Date Route   Pfizer COVID-19 Vaccine 05/26/2020  1:43 PM 0.3 mL 05/02/2020 Intramuscular   Manufacturer: [employer]   Lot: [id number]   [place]: [id number]

## 2020-09-11 DIAGNOSIS — K5792 Diverticulitis of intestine, part unspecified, without perforation or abscess without bleeding: Secondary | ICD-10-CM | POA: Diagnosis not present

## 2020-09-12 ENCOUNTER — Other Ambulatory Visit: Payer: Self-pay | Admitting: Internal Medicine

## 2020-09-12 DIAGNOSIS — K5792 Diverticulitis of intestine, part unspecified, without perforation or abscess without bleeding: Secondary | ICD-10-CM

## 2020-10-01 ENCOUNTER — Other Ambulatory Visit: Payer: Self-pay

## 2020-10-01 ENCOUNTER — Ambulatory Visit
Admission: RE | Admit: 2020-10-01 | Discharge: 2020-10-01 | Disposition: A | Discharge status: Home / Self Care | Payer: BC Managed Care – PPO | Source: Ambulatory Visit | Attending: Internal Medicine | Admitting: Internal Medicine

## 2020-10-01 DIAGNOSIS — K5730 Diverticulosis of large intestine without perforation or abscess without bleeding: Secondary | ICD-10-CM | POA: Diagnosis not present

## 2020-10-01 DIAGNOSIS — K5792 Diverticulitis of intestine, part unspecified, without perforation or abscess without bleeding: Secondary | ICD-10-CM

## 2020-10-11 ENCOUNTER — Other Ambulatory Visit (HOSPITAL_COMMUNITY): Payer: Self-pay | Admitting: Gastroenterology

## 2020-10-11 ENCOUNTER — Other Ambulatory Visit: Payer: Self-pay | Admitting: Gastroenterology

## 2020-10-11 DIAGNOSIS — R110 Nausea: Secondary | ICD-10-CM | POA: Diagnosis not present

## 2020-10-11 DIAGNOSIS — R140 Abdominal distension (gaseous): Secondary | ICD-10-CM | POA: Diagnosis not present

## 2020-10-11 DIAGNOSIS — Z86010 Personal history of colonic polyps: Secondary | ICD-10-CM | POA: Diagnosis not present

## 2020-10-11 DIAGNOSIS — R109 Unspecified abdominal pain: Secondary | ICD-10-CM | POA: Diagnosis not present

## 2020-10-24 DIAGNOSIS — E039 Hypothyroidism, unspecified: Secondary | ICD-10-CM | POA: Diagnosis not present

## 2020-10-24 DIAGNOSIS — Z0001 Encounter for general adult medical examination with abnormal findings: Secondary | ICD-10-CM | POA: Diagnosis not present

## 2020-10-24 DIAGNOSIS — E7800 Pure hypercholesterolemia, unspecified: Secondary | ICD-10-CM | POA: Diagnosis not present

## 2020-10-24 DIAGNOSIS — R7301 Impaired fasting glucose: Secondary | ICD-10-CM | POA: Diagnosis not present

## 2020-10-24 DIAGNOSIS — K5792 Diverticulitis of intestine, part unspecified, without perforation or abscess without bleeding: Secondary | ICD-10-CM | POA: Diagnosis not present

## 2020-10-29 DIAGNOSIS — E7800 Pure hypercholesterolemia, unspecified: Secondary | ICD-10-CM | POA: Diagnosis not present

## 2020-10-29 DIAGNOSIS — K760 Fatty (change of) liver, not elsewhere classified: Secondary | ICD-10-CM | POA: Diagnosis not present

## 2020-10-29 DIAGNOSIS — K5790 Diverticulosis of intestine, part unspecified, without perforation or abscess without bleeding: Secondary | ICD-10-CM | POA: Diagnosis not present

## 2020-10-29 DIAGNOSIS — Z0000 Encounter for general adult medical examination without abnormal findings: Secondary | ICD-10-CM | POA: Diagnosis not present

## 2020-10-29 DIAGNOSIS — R42 Dizziness and giddiness: Secondary | ICD-10-CM | POA: Diagnosis not present

## 2020-11-05 ENCOUNTER — Ambulatory Visit (HOSPITAL_COMMUNITY)
Admission: RE | Admit: 2020-11-05 | Discharge: 2020-11-05 | Disposition: A | Discharge status: Home / Self Care | Payer: BC Managed Care – PPO | Source: Ambulatory Visit | Attending: Gastroenterology | Admitting: Gastroenterology

## 2020-11-05 ENCOUNTER — Other Ambulatory Visit: Payer: Self-pay

## 2020-11-05 DIAGNOSIS — R140 Abdominal distension (gaseous): Secondary | ICD-10-CM | POA: Diagnosis not present

## 2020-11-05 DIAGNOSIS — R110 Nausea: Secondary | ICD-10-CM | POA: Diagnosis not present

## 2020-11-05 MED ORDER — TECHNETIUM TC 99M MEBROFENIN IV KIT
5.3000 | PACK | Freq: Once | INTRAVENOUS | Status: AC | PRN
Start: 2020-11-05 — End: 2020-11-05
  Administered 2020-11-05: 5.3 via INTRAVENOUS

## 2020-12-12 DIAGNOSIS — H1045 Other chronic allergic conjunctivitis: Secondary | ICD-10-CM | POA: Diagnosis not present

## 2020-12-12 DIAGNOSIS — H0288B Meibomian gland dysfunction left eye, upper and lower eyelids: Secondary | ICD-10-CM | POA: Diagnosis not present

## 2020-12-12 DIAGNOSIS — H0288A Meibomian gland dysfunction right eye, upper and lower eyelids: Secondary | ICD-10-CM | POA: Diagnosis not present

## 2020-12-12 DIAGNOSIS — H25813 Combined forms of age-related cataract, bilateral: Secondary | ICD-10-CM | POA: Diagnosis not present

## 2021-01-07 DIAGNOSIS — N200 Calculus of kidney: Secondary | ICD-10-CM | POA: Diagnosis not present

## 2021-01-07 DIAGNOSIS — R109 Unspecified abdominal pain: Secondary | ICD-10-CM | POA: Diagnosis not present

## 2021-01-28 DIAGNOSIS — H43393 Other vitreous opacities, bilateral: Secondary | ICD-10-CM | POA: Diagnosis not present

## 2021-01-28 DIAGNOSIS — H2511 Age-related nuclear cataract, right eye: Secondary | ICD-10-CM | POA: Diagnosis not present

## 2021-01-28 DIAGNOSIS — H2513 Age-related nuclear cataract, bilateral: Secondary | ICD-10-CM | POA: Diagnosis not present

## 2021-01-28 DIAGNOSIS — H25013 Cortical age-related cataract, bilateral: Secondary | ICD-10-CM | POA: Diagnosis not present

## 2021-01-28 DIAGNOSIS — H25043 Posterior subcapsular polar age-related cataract, bilateral: Secondary | ICD-10-CM | POA: Diagnosis not present

## 2021-03-04 DIAGNOSIS — H25813 Combined forms of age-related cataract, bilateral: Secondary | ICD-10-CM | POA: Diagnosis not present

## 2021-03-04 DIAGNOSIS — H2513 Age-related nuclear cataract, bilateral: Secondary | ICD-10-CM | POA: Diagnosis not present

## 2021-03-05 DIAGNOSIS — H25042 Posterior subcapsular polar age-related cataract, left eye: Secondary | ICD-10-CM | POA: Diagnosis not present

## 2021-03-05 DIAGNOSIS — H25012 Cortical age-related cataract, left eye: Secondary | ICD-10-CM | POA: Diagnosis not present

## 2021-03-05 DIAGNOSIS — H2511 Age-related nuclear cataract, right eye: Secondary | ICD-10-CM | POA: Diagnosis not present

## 2021-03-05 DIAGNOSIS — H2512 Age-related nuclear cataract, left eye: Secondary | ICD-10-CM | POA: Diagnosis not present

## 2021-03-11 DIAGNOSIS — H2513 Age-related nuclear cataract, bilateral: Secondary | ICD-10-CM | POA: Diagnosis not present

## 2021-03-11 DIAGNOSIS — H25813 Combined forms of age-related cataract, bilateral: Secondary | ICD-10-CM | POA: Diagnosis not present

## 2021-03-12 DIAGNOSIS — H2512 Age-related nuclear cataract, left eye: Secondary | ICD-10-CM | POA: Diagnosis not present

## 2021-04-10 ENCOUNTER — Other Ambulatory Visit: Payer: Self-pay | Admitting: *Deleted

## 2021-04-10 ENCOUNTER — Other Ambulatory Visit: Payer: Self-pay

## 2021-04-10 ENCOUNTER — Other Ambulatory Visit: Payer: Self-pay | Admitting: Urology

## 2021-04-10 DIAGNOSIS — Z125 Encounter for screening for malignant neoplasm of prostate: Secondary | ICD-10-CM

## 2021-04-10 NOTE — Progress Notes (Signed)
Patient: Sean Alvarez           Date of Birth: [birth year]           MRN: [id number] Visit Date: 04/10/2021 PCP: Holland Commons, FNP  Prostate Cancer Screening Date of last physical exam:  (1 year) Date of last rectal exam:  (Unknown) Have you ever had any of the following?: Enlarged prostate Have you ever had or been told you have an allergy to latex products?: No Are you currently taking any natural prostate preparations?: No Are you currently experiencing any urinary symptoms?: No  Prostate Exam Exam not completed. PSA Only.  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   Neck mass, left, below thyroid 01/22/2011   Past Medical History:  Diagnosis Date   Asthma    At risk for sleep apnea    STOP-BANG= 4   SENT TO PCP  02-13-2014   Environmental and seasonal allergies    hx childhood asthma   GERD (gastroesophageal reflux disease)    Hyperlipidemia    Spermatocele    left   Wears glasses     Family History  Problem Relation Age of Onset   Dementia Mother    Thyroid disease Mother    Colitis Mother    Hypertension Father    Thyroid disease Sister     Social History   Occupational History   Not on file  Tobacco Use   Smoking status: Never   Smokeless tobacco: Never  Vaping Use   Vaping Use: Never used  Substance and Sexual Activity   Alcohol use: Yes    Alcohol/week: 4.0 - 6.0 standard drinks    Types: 4 - 6 Cans of beer per week    Comment: occasional   Drug use: Never   Sexual activity: Yes

## 2021-04-11 LAB — PSA: Prostate Specific Ag, Serum: 1.9 ng/mL (ref 0.0–4.0)

## 2021-07-01 DIAGNOSIS — H26493 Other secondary cataract, bilateral: Secondary | ICD-10-CM | POA: Diagnosis not present

## 2021-07-01 DIAGNOSIS — H26491 Other secondary cataract, right eye: Secondary | ICD-10-CM | POA: Diagnosis not present

## 2021-07-01 DIAGNOSIS — H02831 Dermatochalasis of right upper eyelid: Secondary | ICD-10-CM | POA: Diagnosis not present

## 2021-07-01 DIAGNOSIS — H43393 Other vitreous opacities, bilateral: Secondary | ICD-10-CM | POA: Diagnosis not present

## 2021-07-01 DIAGNOSIS — Z961 Presence of intraocular lens: Secondary | ICD-10-CM | POA: Diagnosis not present

## 2021-09-17 DIAGNOSIS — H26492 Other secondary cataract, left eye: Secondary | ICD-10-CM | POA: Diagnosis not present

## 2021-09-17 DIAGNOSIS — H02885 Meibomian gland dysfunction left lower eyelid: Secondary | ICD-10-CM | POA: Diagnosis not present

## 2021-09-17 DIAGNOSIS — H02882 Meibomian gland dysfunction right lower eyelid: Secondary | ICD-10-CM | POA: Diagnosis not present

## 2021-09-17 DIAGNOSIS — H43812 Vitreous degeneration, left eye: Secondary | ICD-10-CM | POA: Diagnosis not present

## 2021-10-16 DIAGNOSIS — R1032 Left lower quadrant pain: Secondary | ICD-10-CM | POA: Diagnosis not present

## 2021-10-28 DIAGNOSIS — H43393 Other vitreous opacities, bilateral: Secondary | ICD-10-CM | POA: Diagnosis not present

## 2021-10-28 DIAGNOSIS — H26492 Other secondary cataract, left eye: Secondary | ICD-10-CM | POA: Diagnosis not present

## 2021-10-28 DIAGNOSIS — H02834 Dermatochalasis of left upper eyelid: Secondary | ICD-10-CM | POA: Diagnosis not present

## 2021-10-28 DIAGNOSIS — H43811 Vitreous degeneration, right eye: Secondary | ICD-10-CM | POA: Diagnosis not present

## 2021-10-30 DIAGNOSIS — R7301 Impaired fasting glucose: Secondary | ICD-10-CM | POA: Diagnosis not present

## 2021-10-30 DIAGNOSIS — Z125 Encounter for screening for malignant neoplasm of prostate: Secondary | ICD-10-CM | POA: Diagnosis not present

## 2021-10-30 DIAGNOSIS — E039 Hypothyroidism, unspecified: Secondary | ICD-10-CM | POA: Diagnosis not present

## 2021-10-30 DIAGNOSIS — E7800 Pure hypercholesterolemia, unspecified: Secondary | ICD-10-CM | POA: Diagnosis not present

## 2021-11-06 ENCOUNTER — Other Ambulatory Visit: Payer: Self-pay | Admitting: Registered Nurse

## 2021-11-06 DIAGNOSIS — E7800 Pure hypercholesterolemia, unspecified: Secondary | ICD-10-CM

## 2021-11-06 DIAGNOSIS — Z0000 Encounter for general adult medical examination without abnormal findings: Secondary | ICD-10-CM | POA: Diagnosis not present

## 2021-11-06 DIAGNOSIS — E039 Hypothyroidism, unspecified: Secondary | ICD-10-CM | POA: Diagnosis not present

## 2021-11-06 DIAGNOSIS — Z23 Encounter for immunization: Secondary | ICD-10-CM | POA: Diagnosis not present

## 2021-12-04 ENCOUNTER — Ambulatory Visit
Admission: RE | Admit: 2021-12-04 | Discharge: 2021-12-04 | Disposition: A | Discharge status: Home / Self Care | Payer: No Typology Code available for payment source | Source: Ambulatory Visit | Attending: Registered Nurse | Admitting: Registered Nurse

## 2021-12-04 DIAGNOSIS — E7800 Pure hypercholesterolemia, unspecified: Secondary | ICD-10-CM | POA: Diagnosis not present

## 2022-04-07 DIAGNOSIS — H43811 Vitreous degeneration, right eye: Secondary | ICD-10-CM | POA: Diagnosis not present

## 2022-04-07 DIAGNOSIS — H43312 Vitreous membranes and strands, left eye: Secondary | ICD-10-CM | POA: Diagnosis not present

## 2022-04-07 DIAGNOSIS — H02831 Dermatochalasis of right upper eyelid: Secondary | ICD-10-CM | POA: Diagnosis not present

## 2022-04-07 DIAGNOSIS — H04123 Dry eye syndrome of bilateral lacrimal glands: Secondary | ICD-10-CM | POA: Diagnosis not present

## 2022-04-07 DIAGNOSIS — H43812 Vitreous degeneration, left eye: Secondary | ICD-10-CM | POA: Diagnosis not present

## 2022-07-10 DIAGNOSIS — H43391 Other vitreous opacities, right eye: Secondary | ICD-10-CM | POA: Diagnosis not present

## 2022-07-10 DIAGNOSIS — H43811 Vitreous degeneration, right eye: Secondary | ICD-10-CM | POA: Diagnosis not present

## 2022-07-17 DIAGNOSIS — H43811 Vitreous degeneration, right eye: Secondary | ICD-10-CM | POA: Diagnosis not present

## 2022-07-21 DIAGNOSIS — H43391 Other vitreous opacities, right eye: Secondary | ICD-10-CM | POA: Diagnosis not present

## 2022-07-21 DIAGNOSIS — H43312 Vitreous membranes and strands, left eye: Secondary | ICD-10-CM | POA: Diagnosis not present

## 2022-07-21 DIAGNOSIS — H43811 Vitreous degeneration, right eye: Secondary | ICD-10-CM | POA: Diagnosis not present

## 2022-07-21 DIAGNOSIS — H04123 Dry eye syndrome of bilateral lacrimal glands: Secondary | ICD-10-CM | POA: Diagnosis not present

## 2022-07-30 DIAGNOSIS — R1032 Left lower quadrant pain: Secondary | ICD-10-CM | POA: Diagnosis not present

## 2022-07-31 DIAGNOSIS — H43393 Other vitreous opacities, bilateral: Secondary | ICD-10-CM | POA: Diagnosis not present

## 2022-09-04 DIAGNOSIS — J4520 Mild intermittent asthma, uncomplicated: Secondary | ICD-10-CM | POA: Diagnosis not present

## 2022-09-04 DIAGNOSIS — J302 Other seasonal allergic rhinitis: Secondary | ICD-10-CM | POA: Diagnosis not present

## 2022-09-04 DIAGNOSIS — E039 Hypothyroidism, unspecified: Secondary | ICD-10-CM | POA: Diagnosis not present

## 2022-09-04 DIAGNOSIS — E782 Mixed hyperlipidemia: Secondary | ICD-10-CM | POA: Diagnosis not present

## 2022-09-23 DIAGNOSIS — R1032 Left lower quadrant pain: Secondary | ICD-10-CM | POA: Diagnosis not present

## 2022-10-27 DIAGNOSIS — Z125 Encounter for screening for malignant neoplasm of prostate: Secondary | ICD-10-CM | POA: Diagnosis not present

## 2022-10-27 DIAGNOSIS — E782 Mixed hyperlipidemia: Secondary | ICD-10-CM | POA: Diagnosis not present

## 2022-11-04 DIAGNOSIS — E039 Hypothyroidism, unspecified: Secondary | ICD-10-CM | POA: Diagnosis not present

## 2022-11-04 DIAGNOSIS — E782 Mixed hyperlipidemia: Secondary | ICD-10-CM | POA: Diagnosis not present

## 2022-11-04 DIAGNOSIS — Z6827 Body mass index (BMI) 27.0-27.9, adult: Secondary | ICD-10-CM | POA: Diagnosis not present

## 2022-11-04 DIAGNOSIS — K635 Polyp of colon: Secondary | ICD-10-CM | POA: Diagnosis not present

## 2022-11-04 DIAGNOSIS — J4520 Mild intermittent asthma, uncomplicated: Secondary | ICD-10-CM | POA: Diagnosis not present

## 2022-11-04 DIAGNOSIS — Z0000 Encounter for general adult medical examination without abnormal findings: Secondary | ICD-10-CM | POA: Diagnosis not present

## 2022-11-19 DIAGNOSIS — K5730 Diverticulosis of large intestine without perforation or abscess without bleeding: Secondary | ICD-10-CM | POA: Diagnosis not present

## 2022-11-19 DIAGNOSIS — R1032 Left lower quadrant pain: Secondary | ICD-10-CM | POA: Diagnosis not present

## 2022-11-19 DIAGNOSIS — K644 Residual hemorrhoidal skin tags: Secondary | ICD-10-CM | POA: Diagnosis not present

## 2022-12-04 DIAGNOSIS — N23 Unspecified renal colic: Secondary | ICD-10-CM | POA: Diagnosis not present

## 2022-12-05 DIAGNOSIS — L639 Alopecia areata, unspecified: Secondary | ICD-10-CM | POA: Diagnosis not present

## 2022-12-05 DIAGNOSIS — L818 Other specified disorders of pigmentation: Secondary | ICD-10-CM | POA: Diagnosis not present

## 2022-12-18 DIAGNOSIS — R142 Eructation: Secondary | ICD-10-CM | POA: Diagnosis not present

## 2022-12-18 DIAGNOSIS — R1032 Left lower quadrant pain: Secondary | ICD-10-CM | POA: Diagnosis not present

## 2022-12-24 DIAGNOSIS — H04123 Dry eye syndrome of bilateral lacrimal glands: Secondary | ICD-10-CM | POA: Diagnosis not present

## 2022-12-24 DIAGNOSIS — H43822 Vitreomacular adhesion, left eye: Secondary | ICD-10-CM | POA: Diagnosis not present

## 2022-12-29 DIAGNOSIS — N401 Enlarged prostate with lower urinary tract symptoms: Secondary | ICD-10-CM | POA: Diagnosis not present

## 2022-12-29 DIAGNOSIS — R1030 Lower abdominal pain, unspecified: Secondary | ICD-10-CM | POA: Diagnosis not present

## 2022-12-29 DIAGNOSIS — R109 Unspecified abdominal pain: Secondary | ICD-10-CM | POA: Diagnosis not present

## 2022-12-29 DIAGNOSIS — N200 Calculus of kidney: Secondary | ICD-10-CM | POA: Diagnosis not present

## 2022-12-29 DIAGNOSIS — N138 Other obstructive and reflux uropathy: Secondary | ICD-10-CM | POA: Diagnosis not present

## 2023-04-27 DIAGNOSIS — H43312 Vitreous membranes and strands, left eye: Secondary | ICD-10-CM | POA: Diagnosis not present

## 2023-04-27 DIAGNOSIS — H04123 Dry eye syndrome of bilateral lacrimal glands: Secondary | ICD-10-CM | POA: Diagnosis not present

## 2023-04-27 DIAGNOSIS — H43391 Other vitreous opacities, right eye: Secondary | ICD-10-CM | POA: Diagnosis not present

## 2023-04-27 DIAGNOSIS — H43811 Vitreous degeneration, right eye: Secondary | ICD-10-CM | POA: Diagnosis not present

## 2023-06-10 DIAGNOSIS — E782 Mixed hyperlipidemia: Secondary | ICD-10-CM | POA: Diagnosis not present

## 2023-06-17 DIAGNOSIS — E782 Mixed hyperlipidemia: Secondary | ICD-10-CM | POA: Diagnosis not present

## 2023-06-17 DIAGNOSIS — E039 Hypothyroidism, unspecified: Secondary | ICD-10-CM | POA: Diagnosis not present

## 2023-06-17 DIAGNOSIS — R1904 Left lower quadrant abdominal swelling, mass and lump: Secondary | ICD-10-CM | POA: Diagnosis not present

## 2023-06-17 DIAGNOSIS — J4520 Mild intermittent asthma, uncomplicated: Secondary | ICD-10-CM | POA: Diagnosis not present

## 2023-06-18 DIAGNOSIS — L820 Inflamed seborrheic keratosis: Secondary | ICD-10-CM | POA: Diagnosis not present

## 2023-06-18 DIAGNOSIS — L639 Alopecia areata, unspecified: Secondary | ICD-10-CM | POA: Diagnosis not present

## 2023-06-18 DIAGNOSIS — L570 Actinic keratosis: Secondary | ICD-10-CM | POA: Diagnosis not present

## 2023-06-24 DIAGNOSIS — R1032 Left lower quadrant pain: Secondary | ICD-10-CM | POA: Diagnosis not present

## 2023-06-25 DIAGNOSIS — M7541 Impingement syndrome of right shoulder: Secondary | ICD-10-CM | POA: Diagnosis not present

## 2023-06-25 DIAGNOSIS — M25511 Pain in right shoulder: Secondary | ICD-10-CM | POA: Diagnosis not present

## 2023-06-25 DIAGNOSIS — Z6827 Body mass index (BMI) 27.0-27.9, adult: Secondary | ICD-10-CM | POA: Diagnosis not present
